# Patient Record
Sex: Male | Born: 1965 | Race: White | Hispanic: No | Marital: Married | Smoking: Never smoker
Health system: Southern US, Community
[De-identification: ages and names within clinical notes are randomized; demographics above are authoritative.]

## PROBLEM LIST (undated history)

## (undated) DIAGNOSIS — E785 Hyperlipidemia, unspecified: Secondary | ICD-10-CM

## (undated) HISTORY — PX: BACK SURGERY: SHX140

---

## 2010-07-21 ENCOUNTER — Inpatient Hospital Stay (INDEPENDENT_AMBULATORY_CARE_PROVIDER_SITE_OTHER)
Admission: RE | Admit: 2010-07-21 | Discharge: 2010-07-21 | Disposition: A | Discharge status: Home / Self Care | Payer: Self-pay | Source: Ambulatory Visit | Attending: Family Medicine | Admitting: Family Medicine

## 2010-07-21 ENCOUNTER — Encounter: Payer: Self-pay | Admitting: Family Medicine

## 2010-07-21 DIAGNOSIS — E782 Mixed hyperlipidemia: Secondary | ICD-10-CM | POA: Insufficient documentation

## 2010-07-21 DIAGNOSIS — H109 Unspecified conjunctivitis: Secondary | ICD-10-CM

## 2010-07-21 DIAGNOSIS — J069 Acute upper respiratory infection, unspecified: Secondary | ICD-10-CM

## 2010-07-21 DIAGNOSIS — E785 Hyperlipidemia, unspecified: Secondary | ICD-10-CM | POA: Insufficient documentation

## 2010-07-21 DIAGNOSIS — J0100 Acute maxillary sinusitis, unspecified: Secondary | ICD-10-CM

## 2010-07-21 DIAGNOSIS — H6690 Otitis media, unspecified, unspecified ear: Secondary | ICD-10-CM

## 2010-07-23 ENCOUNTER — Telehealth (INDEPENDENT_AMBULATORY_CARE_PROVIDER_SITE_OTHER): Payer: Self-pay | Admitting: *Deleted

## 2010-08-18 ENCOUNTER — Encounter: Payer: Self-pay | Admitting: Emergency Medicine

## 2010-08-18 ENCOUNTER — Inpatient Hospital Stay (INDEPENDENT_AMBULATORY_CARE_PROVIDER_SITE_OTHER)
Admission: RE | Admit: 2010-08-18 | Discharge: 2010-08-18 | Disposition: A | Discharge status: Home / Self Care | Payer: Private Health Insurance - Indemnity | Source: Ambulatory Visit | Attending: Emergency Medicine | Admitting: Emergency Medicine

## 2010-08-18 DIAGNOSIS — L2089 Other atopic dermatitis: Secondary | ICD-10-CM | POA: Insufficient documentation

## 2010-08-18 DIAGNOSIS — J069 Acute upper respiratory infection, unspecified: Secondary | ICD-10-CM

## 2010-08-18 DIAGNOSIS — J45909 Unspecified asthma, uncomplicated: Secondary | ICD-10-CM | POA: Insufficient documentation

## 2011-01-29 ENCOUNTER — Inpatient Hospital Stay (INDEPENDENT_AMBULATORY_CARE_PROVIDER_SITE_OTHER)
Admission: RE | Admit: 2011-01-29 | Discharge: 2011-01-29 | Disposition: A | Discharge status: Home / Self Care | Payer: Private Health Insurance - Indemnity | Source: Ambulatory Visit | Attending: Family Medicine | Admitting: Family Medicine

## 2011-01-29 ENCOUNTER — Encounter: Payer: Self-pay | Admitting: Family Medicine

## 2011-01-29 DIAGNOSIS — Q828 Other specified congenital malformations of skin: Secondary | ICD-10-CM | POA: Insufficient documentation

## 2011-01-29 DIAGNOSIS — L738 Other specified follicular disorders: Secondary | ICD-10-CM

## 2011-03-11 NOTE — Progress Notes (Signed)
Summary: cold/sore throat/ eye and ear pain  ( room 4)   Vital Signs:  Patient Profile:   45 Years Old Male CC:      Throat/ear(right)/eye pain Height:     72 inches Weight:      235 pounds O2 Sat:      96 % O2 treatment:    Room Air Temp:     99.4 degrees F oral Pulse rate:   100 / minute Resp:     16 per minute BP sitting:   135 / 88  Pt. in pain?   yes  Vitals Entered By: Lavell Islam RN (July 21, 2010 3:29 PM)                   Prior Medication List:  No prior medications documented  Current Allergies (reviewed today): ! AKNE-MYCIN (ERYTHROMYCIN)History of Present Illness Chief Complaint: Throat/ear(right)/eye pain History of Present Illness:  Subjective: Patient complains of sore throat that started one week ago with fever.  This was followed by nasal congestion and right ear feeling clogged + cough No pleuritic pain No wheezing + post-nasal drainage ? sinus pain/pressure + itchy/red eyes, worse on right, for about 2 days No earache but right ear feels clogged No hemoptysis No SOB + fever/chills No nausea No vomiting No abdominal pain No diarrhea No skin rashes + fatigue No myalgias + headache Used OTC meds without relief   REVIEW OF SYSTEMS Constitutional Symptoms       Complains of fever, chills, and fatigue.     Denies night sweats, weight loss, and weight gain.      Comments: Since 07/15/10 Eyes       Complains of eye pain and eye drainage.      Denies change in vision, glasses, contact lenses, and eye surgery.      Comments: since 4/10 Ear/Nose/Throat/Mouth       Complains of ear pain, frequent runny nose, and sore throat.      Denies hearing loss/aids, change in hearing, ear discharge, dizziness, frequent nose bleeds, sinus problems, hoarseness, and tooth pain or bleeding.      Comments: right eye Respiratory       Complains of dry cough.      Denies productive cough, wheezing, shortness of breath, asthma, bronchitis, and emphysema/COPD.    Cardiovascular       Denies murmurs, chest pain, and tires easily with exhertion.    Gastrointestinal       Denies stomach pain, nausea/vomiting, diarrhea, constipation, blood in bowel movements, and indigestion. Genitourniary       Denies painful urination, kidney stones, and loss of urinary control. Neurological       Complains of headaches.      Denies paralysis, seizures, and fainting/blackouts. Musculoskeletal       Denies muscle pain, joint pain, joint stiffness, decreased range of motion, redness, swelling, muscle weakness, and gout.  Skin       Denies bruising, unusual mles/lumps or sores, and hair/skin or nail changes.  Psych       Denies mood changes, temper/anger issues, anxiety/stress, speech problems, depression, and sleep problems. Other Comments: S&S throat/cold started 07/15/10; eye pain/redness started 4/10   Past History:  Family History: Last updated: 07/21/2010 Family History of Skin cancer  Social History: Last updated: 07/21/2010 Never Smoked Alcohol use-yes Drug use-no  Past Medical History: Hyperlipidemia  Past Surgical History: wisdom teeth removal  Family History: Reviewed history and no changes required. Family History of Skin cancer  Social History: Never Smoked Alcohol use-yes Drug use-no Smoking Status:  never Drug Use:  no   Objective:  No acute distress  Eyes:  Pupils are equal, round, and reactive to light and accomdation.  Extraocular movement is intact.  Right conjunctivae are injected.  Fundi negative.  No photophobia Ears:  Canals normal.  Tynmpanic membranes have serous effusion bilaterally and right TM is pink Nose:  Congested turbinates, worse on right with right maxillary sinus tenderness Pharynx:  Normal  Neck:  Supple.  Slightly tender shotty anterior/posterior nodes are palpated bilaterally.  Lungs:  Clear to auscultation.  Breath sounds are equal.  Heart:  Regular rate and rhythm without murmurs, rubs, or gallops.   Abdomen:  Nontender without masses or hepatosplenomegaly.  Bowel sounds are present.  No CVA or flank tenderness.  Extremities:  No edema.   Skin:  No rash Rapid strep test negative  Assessment New Problems: CONJUNCTIVITIS, MILD, RIGHT (ICD-372.30) OTITIS MEDIA, ACUTE, RIGHT (ICD-382.9) UPPER RESPIRATORY INFECTION (ICD-465.9) ACUTE MAXILLARY SINUSITIS (ICD-461.0) HYPERLIPIDEMIA (ICD-272.4)   Plan New Medications/Changes: SULFACETAMIDE SODIUM 10 % SOLN (SULFACETAMIDE SODIUM) 1 or 2 gtts in affected eye q2 to 3 hr  #10cc x 0, 07/21/2010, Donna Christen MD AMOXICILLIN 875 MG TABS (AMOXICILLIN) One by mouth two times a day  #28 x 0, 07/21/2010, Donna Christen MD  New Orders: Services provided After hours-Weekends-Holidays [99051] New Patient Level IV [99204] Rapid Strep [16109] Planning Comments:   Begin amoxicillin, expectorant/decongestant, cough suppressant at bedtime, sulfacetamide ophth suspension.  Increase fluid intake Followup with PCP if not improving 7 to 10 days   The patient and/or caregiver has been counseled thoroughly with regard to medications prescribed including dosage, schedule, interactions, rationale for use, and possible side effects and they verbalize understanding.  Diagnoses and expected course of recovery discussed and will return if not improved as expected or if the condition worsens. Patient and/or caregiver verbalized understanding.  Prescriptions: SULFACETAMIDE SODIUM 10 % SOLN (SULFACETAMIDE SODIUM) 1 or 2 gtts in affected eye q2 to 3 hr  #10cc x 0   Entered and Authorized by:   Donna Christen MD   Signed by:   Donna Christen MD on 07/21/2010   Method used:   Print then Give to Patient   RxID:   [id number] AMOXICILLIN 875 MG TABS (AMOXICILLIN) One by mouth two times a day  #28 x 0   Entered and Authorized by:   Donna Christen MD   Signed by:   Donna Christen MD on 07/21/2010   Method used:   Print then Give to Patient   RxID:    [id number]   Patient Instructions: 1)  Take Mucinex D (guaifenesin with decongestant) twice daily for congestion. 2)  Increase fluid intake, rest. 3)  May use Afrin nasal spray (or generic oxymetazoline) twice daily for about 5 days.  Also recommend using saline nasal spray several times daily and/or saline nasal irrigation. 4)  If cough becomes worse at night, use Delsym Cough Suppressant at bedtime. 5)  May take Ibuprofen 200mg , 4 tabs every 8 hours with food for sore thraot 6)  Followup with ENT physician if not improving 7 to 10 days.   Orders Added: 1)  Services provided After hours-Weekends-Holidays [99051] 2)  New Patient Level IV [99204] 3)  Rapid Strep [29528]    Laboratory Results  Date/Time Received: July 21, 2010 3:42 PM  Date/Time Reported: July 21, 2010 3:42 PM     Appended Document: cold/sore throat/ eye and ear pain  (  room 4) Rapid strep: Negative

## 2011-03-11 NOTE — Progress Notes (Signed)
Summary: F/U from last visit/TM (room 5)   Vital Signs:  Patient Profile:   45 Years Old Male CC:      Asthma Management Height:     72 inches Weight:      233 pounds O2 Sat:      97 % O2 treatment:    Room Air Temp:     99.1 degrees F oral Pulse rate:   76 / minute Resp:     18 per minute BP sitting:   125 / 84  (left arm) Cuff size:   regular  Vitals Entered By: Lavell Islam RN (Aug 18, 2010 12:17 PM)                  Current Allergies (reviewed today): ! AKNE-MYCIN (ERYTHROMYCIN)History of Present Illness History from: patient Chief Complaint: Asthma Management History of Present Illness: 45 Years Old Male complains of onset of cold symptoms for a few weeks.  Kasean had been seen here, treated with antibiotics and feels better.  Still with coughing now and mild asthma exacerbation.  He is also asking for a Triamcinolone cream refill for back eczema. No sore throat + cough No pleuritic pain + wheezing + nasal congestion + post-nasal drainage No sinus pain/pressure No chest congestion No itchy/red eyes No earache No hemoptysis No SOB No chills/sweats No fever No nausea No vomiting No abdominal pain No diarrhea No skin rashes No fatigue No myalgias No headache   REVIEW OF SYSTEMS Constitutional Symptoms      Denies fever, chills, night sweats, weight loss, weight gain, and fatigue.  Eyes       Denies change in vision, eye pain, eye discharge, glasses, contact lenses, and eye surgery. Ear/Nose/Throat/Mouth       Denies hearing loss/aids, change in hearing, ear pain, ear discharge, dizziness, frequent runny nose, frequent nose bleeds, sinus problems, sore throat, hoarseness, and tooth pain or bleeding.  Respiratory       Complains of dry cough, wheezing, shortness of breath, and asthma.      Denies productive cough, bronchitis, and emphysema/COPD.      Comments: no asthma x 2 years Cardiovascular       Denies murmurs, chest pain, and tires easily with  exhertion.    Gastrointestinal       Denies stomach pain, nausea/vomiting, diarrhea, constipation, blood in bowel movements, and indigestion. Genitourniary       Denies painful urination, kidney stones, and loss of urinary control. Neurological       Denies paralysis, seizures, and fainting/blackouts. Musculoskeletal       Denies muscle pain, joint pain, joint stiffness, decreased range of motion, redness, swelling, muscle weakness, and gout.  Skin       Denies bruising, unusual mles/lumps or sores, and hair/skin or nail changes.  Psych       Denies mood changes, temper/anger issues, anxiety/stress, speech problems, depression, and sleep problems. Other Comments: Recent tx for URI; now wheezing/ frequent cough and inhaler has expired   Past History:  Past Medical History: Hyperlipidemia Asthma  Family History: Reviewed history from 07/21/2010 and no changes required. Family History of Skin cancer  Social History: Reviewed history from 07/21/2010 and no changes required. Never Smoked Alcohol use-yes Drug use-no Physical Exam General appearance: well developed, well nourished, coughing during exam Ears: normal, no lesions or deformities Nasal: mucosa pink, nonedematous, no septal deviation, turbinates normal Oral/Pharynx: tongue normal, posterior pharynx without erythema or exudate Chest/Lungs: no rales, wheezes, or rhonchi bilateral, breath sounds  equal without effort Heart: regular rate and  rhythm, no murmur Back: mild back excoriations and irritation c/w eczema MSE: oriented to time, place, and person Assessment New Problems: DERMATITIS, ATOPIC (ICD-691.8) UPPER RESPIRATORY INFECTION, ACUTE (ICD-465.9) ASTHMA (ICD-493.90)   Plan New Medications/Changes: TRIAMCINOLONE ACETONIDE 0.1 % CREA (TRIAMCINOLONE ACETONIDE) apply to affected area two times a day as needed  #100g x 0, 08/18/2010, Hoyt Koch MD VENTOLIN HFA 108 (90 BASE) MCG/ACT AERS (ALBUTEROL SULFATE)  1-2 puffs q6 hrs as needed for cough/wheeze  #1 x 3, 08/18/2010, Hoyt Koch MD  New Orders: Est. Patient Level III [16109] Pulse Oximetry (single measurment) [94760] Services provided After hours-Weekends-Holidays [99051] Planning Comments:   1)  Likely allergic/asthma, so will treat with albuterol.  Patient doesn't want prednisone or solumedrol.  If not improving, may be a good idea to try. 2)  Use nasal saline solution (over the counter) at least 3 times a day. 3)  Use over the counter decongestants like Zyrtec-D every 12 hours as needed to help with congestion. 4)  Can take tylenol every 6 hours or motrin every 8 hours for pain or fever. 5)  Follow up with your primary doctor  if no improvement in 5-7 days, sooner if increasing pain, fever, or new symptoms.    The patient and/or caregiver has been counseled thoroughly with regard to medications prescribed including dosage, schedule, interactions, rationale for use, and possible side effects and they verbalize understanding.  Diagnoses and expected course of recovery discussed and will return if not improved as expected or if the condition worsens. Patient and/or caregiver verbalized understanding.  Prescriptions: TRIAMCINOLONE ACETONIDE 0.1 % CREA (TRIAMCINOLONE ACETONIDE) apply to affected area two times a day as needed  #100g x 0   Entered and Authorized by:   Hoyt Koch MD   Signed by:   Hoyt Koch MD on 08/18/2010   Method used:   Print then Give to Patient   RxID:   [id number] VENTOLIN HFA 108 (90 BASE) MCG/ACT AERS (ALBUTEROL SULFATE) 1-2 puffs q6 hrs as needed for cough/wheeze  #1 x 3   Entered and Authorized by:   Hoyt Koch MD   Signed by:   Hoyt Koch MD on 08/18/2010   Method used:   Print then Give to Patient   RxID:   [id number]   Orders Added: 1)  Est. Patient Level III [29528] 2)  Pulse Oximetry (single measurment) [94760] 3)  Services provided After  hours-Weekends-Holidays [41324]

## 2011-03-11 NOTE — Telephone Encounter (Signed)
  Phone Note Outgoing Call Call back at [facility] Phone [phone]   Call placed by: Lajean Saver RN,  July 23, 2010 11:39 AM Call placed to: Patient Action Taken: Phone Call Completed Summary of Call: Callback: Patient's symptoms are resolving slowly but he is still coughing a lot at night after taking the Delsym. I offered for something to be called in but he declined and wants to wait another day. Call back if questions or concerns.

## 2011-03-11 NOTE — Progress Notes (Signed)
Summary: lump on left armpit (rm 5)   Vital Signs:  Patient Profile:   45 Years Old Male CC:      lump in left axillia x 5 days Height:     72 inches Weight:      239 pounds O2 Sat:      98 % O2 treatment:    Room Air Temp:     97.7 degrees F oral Pulse rate:   78 / minute Resp:     14 per minute BP sitting:   128 / 81  (left arm) Cuff size:   large  Vitals Entered By: Lajean Saver RN (January 29, 2011 8:08 AM)                  Updated Prior Medication List: VENTOLIN HFA 108 (90 BASE) MCG/ACT AERS (ALBUTEROL SULFATE) 1-2 puffs q6 hrs as needed for cough/wheeze  Current Allergies (reviewed today): ! AKNE-MYCIN (ERYTHROMYCIN)History of Present Illness Chief Complaint: lump in left axillia x 5 days History of Present Illness:  Subjective:  Patient complains of noticing a slightly tender bump in his left axilla about 5 days ago, then 3 days ago noticed a ? "spider bite" on his left upper arm near left axilla.  He feels well otherwise. He also wonders if his chronic rash on back and upper outer arms could be related.  REVIEW OF SYSTEMS Constitutional Symptoms      Denies fever, chills, night sweats, weight loss, weight gain, and fatigue.  Eyes       Denies change in vision, eye pain, eye discharge, glasses, contact lenses, and eye surgery. Ear/Nose/Throat/Mouth       Denies hearing loss/aids, change in hearing, ear pain, ear discharge, dizziness, frequent runny nose, frequent nose bleeds, sinus problems, sore throat, hoarseness, and tooth pain or bleeding.  Respiratory       Denies dry cough, productive cough, wheezing, shortness of breath, asthma, bronchitis, and emphysema/COPD.  Cardiovascular       Denies murmurs, chest pain, and tires easily with exhertion.    Gastrointestinal       Denies stomach pain, nausea/vomiting, diarrhea, constipation, blood in bowel movements, and indigestion. Genitourniary       Denies painful urination, kidney stones, and loss of urinary  control. Neurological       Denies paralysis, seizures, and fainting/blackouts. Musculoskeletal       Denies muscle pain, joint pain, joint stiffness, decreased range of motion, redness, swelling, muscle weakness, and gout.  Skin       Complains of unusual moles/lumps or sores.      Denies bruising and hair/skin or nail changes.  Psych       Denies mood changes, temper/anger issues, anxiety/stress, speech problems, depression, and sleep problems. Other Comments: Patient c/o lump in left axillia x 5days. Without pain. No redness.    Past History:  Past Medical History: Reviewed history from 08/18/2010 and no changes required. Hyperlipidemia Asthma  Past Surgical History: Reviewed history from 07/21/2010 and no changes required. wisdom teeth removal  Family History: Reviewed history from 07/21/2010 and no changes required. Family History of Skin cancer  Social History: Reviewed history from 07/21/2010 and no changes required. Never Smoked Alcohol use-yes Drug use-no   Objective:  Appearance:  Patient appears healthy, stated age, and in no acute distress  Eyes:  Pupils are equal, round, and reactive to light and accomodation.  Extraocular movement is intact.  Conjunctivae are not inflamed.  Pharynx/mouth:  Normal  Neck:  Supple.  No adenopathy is present.   Lungs:  Clear to auscultation.  Breath sounds are equal.  Heart:  Regular rate and rhythm without murmurs, rubs, or gallops.  Abdomen:  Nontender without masses or hepatosplenomegaly.  Bowel sounds are present.  No CVA or flank tenderness.  Left axilla:  1cm node palpated, slightly tender.  Skin:  On left upper arm just distal to axilla are two follicular pustules on erythematous base.  On the back and upper outer arms are numerous small 1mm to 2mm erythematous macules, somewhat scaly, around follicles.  Upper outer arms reveal rough texture. 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 Patient Level III [81191] Planning Comments:   With sterile technique, opened small pustule left upper arm for culture specimen.  Bacitracin and bandage applied.  Begin Keflex. Given [facility] information/treatment handout for keratosis pilaris. Return for re-check if rash worsens.   The patient and/or caregiver has been counseled thoroughly with regard to medications prescribed including dosage, schedule, interactions, rationale for use, and possible side effects and they verbalize understanding.  Diagnoses and expected course of recovery discussed and will return if not improved as expected or if the condition worsens. Patient and/or caregiver verbalized understanding.  Prescriptions: CEPHALEXIN 500 MG CAPS (CEPHALEXIN) One by mouth two times a day  #14 x 0   Entered and Authorized by:   Donna Christen MD   Signed by:   Donna Christen MD on 01/29/2011   Method used:   Print then Give to Patient   RxID:   [id number]   Orders Added: 1)  T-Culture, Wound [[id number]] 2)  Est. Patient Level III [46962]

## 2011-04-15 ENCOUNTER — Emergency Department
Admission: EM | Admit: 2011-04-15 | Discharge: 2011-04-15 | Disposition: A | Discharge status: Home / Self Care | Payer: Private Health Insurance - Indemnity | Source: Home / Self Care | Attending: Emergency Medicine | Admitting: Emergency Medicine

## 2011-04-15 ENCOUNTER — Encounter: Payer: Self-pay | Admitting: *Deleted

## 2011-04-15 DIAGNOSIS — J069 Acute upper respiratory infection, unspecified: Secondary | ICD-10-CM

## 2011-04-15 DIAGNOSIS — J329 Chronic sinusitis, unspecified: Secondary | ICD-10-CM

## 2011-04-15 HISTORY — DX: Hyperlipidemia, unspecified: E78.5

## 2011-04-15 MED ORDER — AMOXICILLIN 875 MG PO TABS: 875.0000 mg | ORAL_TABLET | Freq: Two times a day (BID) | ORAL | Status: AC

## 2011-04-15 NOTE — ED Notes (Signed)
Patient c/o congestion x 6 weeks. He has used saline nasal spray.

## 2011-04-15 NOTE — ED Provider Notes (Signed)
History     CSN: [id number]  Arrival date & time 04/15/11  4098   First MD Initiated Contact with Patient 04/15/11 [phone]      Chief Complaint  Patient presents with  . Nasal Congestion    (Consider location/radiation/quality/duration/timing/severity/associated sxs/prior treatment) HPI Rhiley is a 46 y.o. male who complains of onset of cold symptoms for 6 weeks  No sore throat No cough No pleuritic pain No wheezing + nasal congestion + post-nasal drainage + sinus pain/pressure No chest congestion No itchy/red eyes No earache No hemoptysis No SOB No chills/sweats No fever No nausea No vomiting No abdominal pain No diarrhea No skin rashes No fatigue No myalgias No headache    Past Medical History  Diagnosis Date  . Asthma   . Hyperlipidemia     History reviewed. No pertinent past surgical history.  History reviewed. No pertinent family history.  History  Substance Use Topics  . Smoking status: Never Smoker   . Smokeless tobacco: Not on file  . Alcohol Use: No      Review of Systems  Allergies  Akne-mycin  Home Medications   Current Outpatient Rx  Name Route Sig Dispense Refill  . ALBUTEROL SULFATE (2.5 MG/3ML) 0.083% IN NEBU Nebulization Take 2.5 mg by nebulization every 6 (six) hours as needed.        BP 127/89  Pulse 75  Temp(Src) 97.6 F (36.4 C) (Oral)  Resp 12  Ht 6' (1.829 m)  Wt 240 lb (108.863 kg)  BMI 32.55 kg/m2  SpO2 99%  Physical Exam  Nursing note and vitals reviewed. Constitutional: He is oriented to person, place, and time. He appears well-developed and well-nourished.  HENT:  Head: Normocephalic and atraumatic.  Right Ear: Tympanic membrane, external ear and ear canal normal.  Left Ear: Tympanic membrane, external ear and ear canal normal.  Nose: Mucosal edema and rhinorrhea present.  Mouth/Throat: Posterior oropharyngeal erythema present. No oropharyngeal exudate or posterior oropharyngeal edema.  Eyes: No scleral  icterus.  Neck: Neck supple.  Cardiovascular: Regular rhythm and normal heart sounds.   Pulmonary/Chest: Effort normal and breath sounds normal. No respiratory distress.  Neurological: He is alert and oriented to person, place, and time.  Skin: Skin is warm and dry.  Psychiatric: He has a normal mood and affect. His speech is normal.    ED Course  Procedures (including critical care time)  Labs Reviewed - No data to display No results found.   No diagnosis found.    MDM  1)  Take the prescribed antibiotic as instructed. 2)  Use nasal saline solution (over the counter) at least 3 times a day. 3)  Use over the counter decongestants like Zyrtec-D every 12 hours as needed to help with congestion.  If you have hypertension, do not take medicines with sudafed.  4)  Can take tylenol every 6 hours or motrin every 8 hours for pain or fever. 5)  Follow up with your primary doctor if no improvement in 5-7 days, sooner if increasing pain, fever, or new symptoms.     Lily Kocher, MD 04/15/11 [phone]

## 2011-09-28 ENCOUNTER — Encounter: Payer: Self-pay | Admitting: Emergency Medicine

## 2011-09-28 ENCOUNTER — Emergency Department
Admission: EM | Admit: 2011-09-28 | Discharge: 2011-09-28 | Disposition: A | Discharge status: Home / Self Care | Payer: Private Health Insurance - Indemnity | Source: Home / Self Care | Attending: Family Medicine | Admitting: Family Medicine

## 2011-09-28 DIAGNOSIS — J45901 Unspecified asthma with (acute) exacerbation: Secondary | ICD-10-CM

## 2011-09-28 DIAGNOSIS — J069 Acute upper respiratory infection, unspecified: Secondary | ICD-10-CM

## 2011-09-28 MED ORDER — BENZONATATE 200 MG PO CAPS: 200.0000 mg | ORAL_CAPSULE | Freq: Every day | ORAL | Status: AC

## 2011-09-28 MED ORDER — AMOXICILLIN 875 MG PO TABS: 875.0000 mg | ORAL_TABLET | Freq: Two times a day (BID) | ORAL | Status: AC

## 2011-09-28 MED ORDER — PREDNISONE 20 MG PO TABS: 20.0000 mg | ORAL_TABLET | Freq: Two times a day (BID) | ORAL | Status: AC

## 2011-09-28 MED ORDER — ALBUTEROL SULFATE HFA 108 (90 BASE) MCG/ACT IN AERS: 2.0000 | INHALATION_SPRAY | RESPIRATORY_TRACT | Status: DC | PRN

## 2011-09-28 NOTE — Discharge Instructions (Signed)
Take Mucinex D (guaifenesin with decongestant) twice daily for congestion.  Increase fluid intake, rest. May use Afrin nasal spray (or generic oxymetazoline) twice daily for about 5 days.  Also recommend using saline nasal spray several times daily and saline nasal irrigation (AYR is a common brand) Stop all antihistamines for now, and other non-prescription cough/cold preparations. Continue albuterol inhaler as needed. Begin amoxicillin if not improving about 5 days or if persistent fever develops. Recommend a Tdap when well Follow-up with family doctor if not improving 7 to 10 days.

## 2011-09-28 NOTE — ED Provider Notes (Signed)
History     CSN: [id number]  Arrival date & time 09/28/11  1322   First MD Initiated Contact with Patient 09/28/11 1410      Chief Complaint  Patient presents with  . Facial Pain      HPI Comments: Patient complains of approximately 3 day history of gradually progressive URI symptoms beginning with a mild sore throat (now improved), followed by progressive nasal congestion.  A cough started next.    Complains of fatigue and initial myalgias.  Cough is now worse at night and generally non-productive during the day.  There has been no pleuritic pain or shortness of breath but he does wheeze occasionally with activity.  He has a history of mild asthma, but rarely ever needs to use his albuterol inhaler when he is well.  Over the past several days he has used his inhaler several times.  The history is provided by the patient.    Past Medical History  Diagnosis Date  . Asthma   . Hyperlipidemia     History reviewed. No pertinent past surgical history.  History reviewed. No pertinent family history.  History  Substance Use Topics  . Smoking status: Never Smoker   . Smokeless tobacco: Not on file  . Alcohol Use: No      Review of Systems + sore throat + cough No pleuritic pain + wheezing + nasal congestion + post-nasal drainage No sinus pain/pressure No itchy/red eyes No earache No hemoptysis No SOB No fever, + chills No nausea No vomiting No abdominal pain No diarrhea No urinary symptoms No skin rashes + fatigue + myalgias No headache Used OTC meds without relief  Allergies  Erythromycin  Home Medications   Current Outpatient Rx  Name Route Sig Dispense Refill  . ALBUTEROL SULFATE HFA 108 (90 BASE) MCG/ACT IN AERS Inhalation Inhale 2 puffs into the lungs every 4 (four) hours as needed for wheezing. 1 Inhaler 1  . ALBUTEROL SULFATE (2.5 MG/3ML) 0.083% IN NEBU Nebulization Take 2.5 mg by nebulization every 6 (six) hours as needed.      . AMOXICILLIN 875  MG PO TABS Oral Take 1 tablet (875 mg total) by mouth 2 (two) times daily. (Rx void after 10/07/11) 20 tablet 0  . BENZONATATE 200 MG PO CAPS Oral Take 1 capsule (200 mg total) by mouth at bedtime. Take as needed for cough 12 capsule 0  . PREDNISONE 20 MG PO TABS Oral Take 1 tablet (20 mg total) by mouth 2 (two) times daily. 10 tablet 0    BP 144/78  Pulse 78  Temp 98.3 F (36.8 C) (Oral)  Resp 16  Ht 6' (1.829 m)  Wt 240 lb (108.863 kg)  BMI 32.55 kg/m2  SpO2 96%  Physical Exam Nursing notes and Vital Signs reviewed. Appearance:  Patient appears healthy, stated age, and in no acute distress Eyes:  Pupils are equal, round, and reactive to light and accomodation.  Extraocular movement is intact.  Conjunctivae are not inflamed  Ears:  Canals normal.  Tympanic membranes normal.  Nose:  Mildly congested turbinates.  No sinus tenderness.  Pharynx:  Normal Neck:  Supple.  Slightly tender shotty posterior nodes are palpated bilaterally  Lungs:  Clear to auscultation.  Breath sounds are equal.  Heart:  Regular rate and rhythm without murmurs, rubs, or gallops.  Abdomen:  Nontender without masses or hepatosplenomegaly.  Bowel sounds are present.  No CVA or flank tenderness.  Extremities:  No edema.  No calf tenderness Skin:  No rash present.   ED Course  Procedures  none      1. Acute upper respiratory infections of unspecified site   2. Asthma exacerbation       MDM  There is no evidence of bacterial infection today.   Begin prednisone burst.  Rx for albuterol inhaler.  Prescription written for Benzonatate [facility]) to take at bedtime for night-time cough.  Take Mucinex D (guaifenesin with decongestant) twice daily for congestion.  Increase fluid intake, rest. May use Afrin nasal spray (or generic oxymetazoline) twice daily for about 5 days.  Also recommend using saline nasal spray several times daily and saline nasal irrigation (AYR is a common brand) Stop all antihistamines for  now, and other non-prescription cough/cold preparations. Continue albuterol inhaler as needed. Begin amoxicillin if not improving about 5 days or if persistent fever develops (Given a prescription to hold, with an expiration date)  Recommend a Tdap when well Follow-up with family doctor if not improving 7 to 10 days.         Lattie Haw, MD 09/28/11 [phone]

## 2011-09-28 NOTE — ED Notes (Signed)
Sinus congestion x 3 days; states not painful, but recent similar episodes required antibiotics.

## 2011-11-08 ENCOUNTER — Other Ambulatory Visit: Payer: Self-pay | Admitting: Family Medicine

## 2011-11-08 NOTE — Telephone Encounter (Signed)
Please call patient. 1. Is he a regular patient here or at [facility] with Dr. Cathren Harsh? 2. I'm concerned about his use of the rescue inhaler-I see he's had several URI's recently, but he may need a medication adjustment if he's using the rescue regularly.

## 2011-11-08 NOTE — Telephone Encounter (Signed)
No paper chart °

## 2012-07-22 ENCOUNTER — Emergency Department (INDEPENDENT_AMBULATORY_CARE_PROVIDER_SITE_OTHER)
Admission: EM | Admit: 2012-07-22 | Discharge: 2012-07-22 | Disposition: A | Discharge status: Home / Self Care | Payer: BC Managed Care – PPO | Source: Home / Self Care | Attending: Family Medicine | Admitting: Family Medicine

## 2012-07-22 ENCOUNTER — Encounter: Payer: Self-pay | Admitting: *Deleted

## 2012-07-22 DIAGNOSIS — M545 Low back pain: Secondary | ICD-10-CM

## 2012-07-22 DIAGNOSIS — M62838 Other muscle spasm: Secondary | ICD-10-CM

## 2012-07-22 DIAGNOSIS — M7712 Lateral epicondylitis, left elbow: Secondary | ICD-10-CM

## 2012-07-22 DIAGNOSIS — M7710 Lateral epicondylitis, unspecified elbow: Secondary | ICD-10-CM

## 2012-07-22 MED ORDER — MELOXICAM 15 MG PO TABS: 15.0000 mg | ORAL_TABLET | Freq: Every day | ORAL | Status: DC

## 2012-07-22 MED ORDER — CYCLOBENZAPRINE HCL 10 MG PO TABS: 10.0000 mg | ORAL_TABLET | Freq: Two times a day (BID) | ORAL | Status: DC | PRN

## 2012-07-22 NOTE — ED Notes (Signed)
Martin Kennedy c/o lower right back pain/gluteal pain x 2 days. Numbness and tingling down his right leg. Pain is present with movement.

## 2012-07-22 NOTE — ED Provider Notes (Signed)
History     CSN: [id number]  Arrival date & time 07/22/12  1320   First MD Initiated Contact with Patient 07/22/12 1411      Chief Complaint  Patient presents with  . Back Pain       HPI Comments: Patient was using a shovel two days ago and subsequently developed pain in his right lower back/buttock that radiates to his right posterior calf with some movements.  He states that he has an occasional flare-up of lower back pain.  About four years ago he had a persistent flare-up of back pain.  An MRI at that time showed questionable impingement at L4-L5, but his back pain resolved spontaneously.  He denies bowel or bladder dysfunction, and no saddle numbness. He also complains of recurring stiffness in his right neck and intermittent tingling in his left arm that is positional.  Patient is a 47 y.o. male presenting with back pain. The history is provided by the patient.  Back Pain Location:  Gluteal region Quality:  Aching Radiates to:  R posterior upper leg Pain severity:  Mild Pain is:  Same all the time Onset quality:  Gradual Duration:  2 days Timing:  Constant Progression:  Unchanged Chronicity:  Recurrent Context comment:  Shovelling Relieved by:  Nothing Worsened by:  Movement and bending Ineffective treatments:  NSAIDs Associated symptoms: leg pain   Associated symptoms: no abdominal pain, no bladder incontinence, no bowel incontinence, no dysuria, no fever, no numbness, no paresthesias, no pelvic pain, no perianal numbness, no tingling, no weakness and no weight loss     Past Medical History  Diagnosis Date  . Asthma   . Hyperlipidemia     History reviewed. No pertinent past surgical history.  History reviewed. No pertinent family history.  History  Substance Use Topics  . Smoking status: Never Smoker   . Smokeless tobacco: Not on file  . Alcohol Use: No      Review of Systems  Constitutional: Negative for fever and weight loss.  Gastrointestinal:  Negative for abdominal pain and bowel incontinence.  Genitourinary: Negative for bladder incontinence, dysuria and pelvic pain.  Musculoskeletal: Positive for back pain.  Neurological: Negative for tingling, weakness, numbness and paresthesias.  All other systems reviewed and are negative.    Allergies  Erythromycin  Home Medications   Current Outpatient Rx  Name  Route  Sig  Dispense  Refill  . albuterol (PROVENTIL HFA;VENTOLIN HFA) 108 (90 BASE) MCG/ACT inhaler   Inhalation   Inhale 2 puffs into the lungs every 4 (four) hours as needed for wheezing.   1 Inhaler   1   . cyclobenzaprine (FLEXERIL) 10 MG tablet   Oral   Take 1 tablet (10 mg total) by mouth 2 (two) times daily as needed for muscle spasms.   20 tablet   0   . meloxicam (MOBIC) 15 MG tablet   Oral   Take 1 tablet (15 mg total) by mouth daily. Take with food each morning   15 tablet   0     BP 139/81  Pulse 72  Resp 14  Ht 6' (1.829 m)  Wt 242 lb (109.77 kg)  BMI 32.81 kg/m2  SpO2 98%  Physical Exam Nursing notes and Vital Signs reviewed. Appearance:  Patient appears healthy, stated age, and in no acute distress Eyes:  Pupils are equal, round, and reactive to light and accomodation.  Extraocular movement is intact.  Conjunctivae are not inflamed  Pharynx:  Normal Neck:  Supple.  No adenopathy.  Good range of motion with minimal trapezius tenderness. Lungs:  Clear to auscultation.  Breath sounds are equal.  Heart:  Regular rate and rhythm without murmurs, rubs, or gallops.   Extremities:  No edema.  Left shoulder full range of motion.  Left elbow:  There is distinct tenderness over the lateral epicondyle.  Palpation there during resisted dorsiflexion and supination of the wrist recreates his elbow pain.  Skin:  No rash present.   Back:  Good range of motion.  Can heel/toe walk and squat without difficulty.  No tenderness palpated.    Straight leg raising test is negative.  Sitting knee extension test is  negative.  Strength and sensation in the lower extremities is normal.  Patellar and achilles reflexes are normal  ED Course  Procedures        1. Low back pain radiating to right leg; consider possibility of piriformis syndrome   2. Neck muscle spasm; recurrent   3. Lateral epicondylitis, left       MDM  Patient declines corticosteroid injection and X-ray C-spine. Begin Mobic and Flexeril Apply ice pack for 30 to 45 minutes every 1 to 4 hours.  Continue for 2 to 3 days.  Begin range of motion exercises as per instruction sheets ([facility] information and instruction handouts given)   Resume left tennis elbow strap (patient has one at home) Followup with Dr. Rodney Langton for further evaluation and management in one week.        Lattie Haw, MD 07/22/12 2003

## 2012-07-30 ENCOUNTER — Ambulatory Visit (INDEPENDENT_AMBULATORY_CARE_PROVIDER_SITE_OTHER): Payer: BC Managed Care – PPO

## 2012-07-30 ENCOUNTER — Encounter: Payer: Self-pay | Admitting: Sports Medicine

## 2012-07-30 ENCOUNTER — Ambulatory Visit (INDEPENDENT_AMBULATORY_CARE_PROVIDER_SITE_OTHER): Payer: BC Managed Care – PPO | Admitting: Sports Medicine

## 2012-07-30 VITALS — BP 143/90 | HR 70 | Wt 241.0 lb

## 2012-07-30 DIAGNOSIS — M5412 Radiculopathy, cervical region: Secondary | ICD-10-CM

## 2012-07-30 NOTE — Progress Notes (Signed)
   Subjective:    I'm seeing this patient as a consultation for:  Dr. Cathren Harsh  CC: Neck pain  HPI: Martin Kennedy is a very pleasant 70 her old male with a history of lumbar radiculitis that has since resolved he went to urgent care with neck pain radiating down his left arm in the C6 distribution. He doesn't recall any specific injuries. He was evaluated, and was prescribed Mobic and Flexeril which she has not yet taken. He is only taking occasional ibuprofen. Since then the pain has resolved, and he notes about a 1/10. He still does have some numbness that he localizes in the left C6 distribution. Symptoms are worse with rotating his head to the left and with extension. He denies any bowel or bladder dysfunction, or any weakness in his lower extremities. He really desires a very conservative approach minimizing the use of any medications.  Past medical history, Surgical history, Family history not pertinant except as noted below, Social history, Allergies, and medications have been entered into the medical record, reviewed, and no changes needed.   Review of Systems: No headache, visual changes, nausea, vomiting, diarrhea, constipation, dizziness, abdominal pain, skin rash, fevers, chills, night sweats, weight loss, swollen lymph nodes, body aches, joint swelling, muscle aches, chest pain, shortness of breath, mood changes, visual or auditory hallucinations.   Objective:   General: Well Developed, well nourished, and in no acute distress.  Neuro/Psych: Alert and oriented x3, extra-ocular muscles intact, able to move all 4 extremities, sensation grossly intact. Skin: Warm and dry, no rashes noted.  Respiratory: Not using accessory muscles, speaking in full sentences, trachea midline.  Cardiovascular: Pulses palpable, no extremity edema. Abdomen: Does not appear distended. Neck: Inspection unremarkable. No palpable stepoffs. Positive Spurling's maneuver. Full neck range of motion Grip strength and  sensation normal in bilateral hands Strength good C4 to T1 distribution No sensory change to C4 to T1 Negative Hoffman sign bilaterally Reflexes normal with the exception of left C6/biceps tendon. This is hypoactive.  X-rays show straightening of the normal cervical lordosis suggestive of spasm. Impression and Recommendations:   This case required medical decision making of moderate complexity.

## 2012-07-30 NOTE — Assessment & Plan Note (Signed)
Left C6. Pain has resolved, numbness persists. He would rather treat this extremely conservatively without oral medications. I think this is appropriate, cervical rehabilitation exercises given. I would like some cervical spine x-rays, he'll come back to see me in one month.

## 2012-08-19 ENCOUNTER — Encounter: Payer: Self-pay | Admitting: Sports Medicine

## 2012-08-19 ENCOUNTER — Ambulatory Visit (INDEPENDENT_AMBULATORY_CARE_PROVIDER_SITE_OTHER): Payer: BC Managed Care – PPO | Admitting: Sports Medicine

## 2012-08-19 VITALS — BP 146/86 | HR 73 | Wt 241.0 lb

## 2012-08-19 DIAGNOSIS — M5412 Radiculopathy, cervical region: Secondary | ICD-10-CM

## 2012-08-19 MED ORDER — MELOXICAM 15 MG PO TABS: ORAL_TABLET | ORAL | Status: DC

## 2012-08-19 MED ORDER — PREDNISONE 50 MG PO TABS: ORAL_TABLET | ORAL | Status: DC

## 2012-08-19 NOTE — Progress Notes (Signed)
  Subjective:    CC: Followup  HPI: Left cervical radiculitis:  Crit, whose father is a retired Investment banker, operational, comes in for followup of the left C6 radiculitis consisting mostly of numbness. He wanted to go extremely conservative at the last visit, and not do any medications or therapy. He returns today with symptoms approximately the same, and a sensation of weakening of his lower extremities. He denies any bowel or bladder dysfunction or saddle anesthesia, denies any constitutional symptoms.  Past medical history, Surgical history, Family history not pertinant except as noted below, Social history, Allergies, and medications have been entered into the medical record, reviewed, and no changes needed.   Review of Systems: No fevers, chills, night sweats, weight loss, chest pain, or shortness of breath.   Objective:    General: Well Developed, well nourished, and in no acute distress.  Neuro: Alert and oriented x3, extra-ocular muscles intact, sensation grossly intact.  HEENT: Normocephalic, atraumatic, pupils equal round reactive to light, neck supple, no masses, no lymphadenopathy, thyroid nonpalpable.  Skin: Warm and dry, no rashes. Cardiac: Regular rate and rhythm, no murmurs rubs or gallops, no lower extremity edema.  Respiratory: Clear to auscultation bilaterally. Not using accessory muscles, speaking in full sentences. Neck: Inspection unremarkable. No palpable stepoffs. Negative Spurling's maneuver. Full neck range of motion Grip strength and sensation normal in bilateral hands Strength good C4 to T1 distribution No sensory change to C4 to T1 Negative Hoffman sign bilaterally Reflexes normal.  I spent 40 minutes with this patient, greater than 50% face time counseling regarding cervical radiculitis  Impression and Recommendations:

## 2012-08-19 NOTE — Assessment & Plan Note (Addendum)
Left C6. We discussed anatomy and pathophysiology in exquisite detail. Unfortunately we have been waiting, and treating him with medication. Symptoms have persisted, prednisone, cervical spine MRI, formal physical therapy. Continue Mobic. Return to go over MRI results.

## 2012-08-20 ENCOUNTER — Ambulatory Visit: Payer: BC Managed Care – PPO | Admitting: Physical Therapy

## 2012-08-20 DIAGNOSIS — M5412 Radiculopathy, cervical region: Secondary | ICD-10-CM

## 2012-08-20 DIAGNOSIS — M2560 Stiffness of unspecified joint, not elsewhere classified: Secondary | ICD-10-CM

## 2012-08-21 ENCOUNTER — Telehealth: Payer: Self-pay | Admitting: *Deleted

## 2012-08-21 NOTE — Telephone Encounter (Signed)
[place] [employer] [place] and no precert is required for MRI Cervical Spine w/o contrast as long as done outpatient. Helen @ [facility] notified. Barry Dienes, LPN

## 2012-08-21 NOTE — Telephone Encounter (Signed)
Spoke with [employer] & no pre cert is needed for MRI c-spine WO contrast.  Linda in [facility] notified.

## 2012-08-24 ENCOUNTER — Encounter: Payer: BC Managed Care – PPO | Admitting: Physical Therapy

## 2012-08-24 DIAGNOSIS — M5412 Radiculopathy, cervical region: Secondary | ICD-10-CM

## 2012-08-24 DIAGNOSIS — M2560 Stiffness of unspecified joint, not elsewhere classified: Secondary | ICD-10-CM

## 2012-08-27 ENCOUNTER — Encounter: Payer: BC Managed Care – PPO | Admitting: Physical Therapy

## 2012-08-27 DIAGNOSIS — M2560 Stiffness of unspecified joint, not elsewhere classified: Secondary | ICD-10-CM

## 2012-08-27 DIAGNOSIS — M5412 Radiculopathy, cervical region: Secondary | ICD-10-CM

## 2012-08-28 ENCOUNTER — Ambulatory Visit: Payer: BC Managed Care – PPO | Admitting: Sports Medicine

## 2012-09-02 ENCOUNTER — Encounter: Payer: BC Managed Care – PPO | Admitting: Physical Therapy

## 2012-09-02 DIAGNOSIS — M5412 Radiculopathy, cervical region: Secondary | ICD-10-CM

## 2012-09-02 DIAGNOSIS — M2560 Stiffness of unspecified joint, not elsewhere classified: Secondary | ICD-10-CM

## 2012-09-04 ENCOUNTER — Encounter: Payer: BC Managed Care – PPO | Admitting: Physical Therapy

## 2012-09-04 DIAGNOSIS — M5412 Radiculopathy, cervical region: Secondary | ICD-10-CM

## 2012-09-04 DIAGNOSIS — M2560 Stiffness of unspecified joint, not elsewhere classified: Secondary | ICD-10-CM

## 2012-09-11 ENCOUNTER — Encounter: Payer: BC Managed Care – PPO | Admitting: Physical Therapy

## 2012-09-11 DIAGNOSIS — M2560 Stiffness of unspecified joint, not elsewhere classified: Secondary | ICD-10-CM

## 2012-09-11 DIAGNOSIS — M5412 Radiculopathy, cervical region: Secondary | ICD-10-CM

## 2012-09-18 ENCOUNTER — Encounter: Payer: BC Managed Care – PPO | Admitting: Physical Therapy

## 2012-09-18 DIAGNOSIS — M2560 Stiffness of unspecified joint, not elsewhere classified: Secondary | ICD-10-CM

## 2012-09-18 DIAGNOSIS — M5412 Radiculopathy, cervical region: Secondary | ICD-10-CM

## 2012-12-24 ENCOUNTER — Ambulatory Visit (INDEPENDENT_AMBULATORY_CARE_PROVIDER_SITE_OTHER): Payer: BC Managed Care – PPO | Admitting: Sports Medicine

## 2012-12-24 ENCOUNTER — Encounter: Payer: Self-pay | Admitting: Sports Medicine

## 2012-12-24 ENCOUNTER — Ambulatory Visit (INDEPENDENT_AMBULATORY_CARE_PROVIDER_SITE_OTHER): Payer: BC Managed Care – PPO

## 2012-12-24 VITALS — BP 137/87 | HR 64 | Wt 246.0 lb

## 2012-12-24 DIAGNOSIS — S6390XA Sprain of unspecified part of unspecified wrist and hand, initial encounter: Secondary | ICD-10-CM

## 2012-12-24 DIAGNOSIS — M79609 Pain in unspecified limb: Secondary | ICD-10-CM

## 2012-12-24 DIAGNOSIS — X58XXXA Exposure to other specified factors, initial encounter: Secondary | ICD-10-CM

## 2012-12-24 DIAGNOSIS — S6980XA Other specified injuries of unspecified wrist, hand and finger(s), initial encounter: Secondary | ICD-10-CM

## 2012-12-24 DIAGNOSIS — M5412 Radiculopathy, cervical region: Secondary | ICD-10-CM

## 2012-12-24 DIAGNOSIS — M19049 Primary osteoarthritis, unspecified hand: Secondary | ICD-10-CM | POA: Insufficient documentation

## 2012-12-24 DIAGNOSIS — S63615A Unspecified sprain of left ring finger, initial encounter: Secondary | ICD-10-CM

## 2012-12-24 NOTE — Assessment & Plan Note (Signed)
Resolved with physical therapy. 

## 2012-12-24 NOTE — Assessment & Plan Note (Signed)
Pain at the volar fourth metacarpophalangeal joint, function is excellent. X-rays. Buddy tape. We will keep him Buddy taped for 2 weeks, if no improvement we can consider injection into the metacarpophalangeal joint.

## 2012-12-24 NOTE — Progress Notes (Signed)
  Subjective:    CC: Finger injury  HPI: Cervical radiculitis: resolved with physical therapy.  Finger injury: 3 weeks ago he sprained his left fourth metacarpal phalangeal joint, continued to have pain in the volar aspect, but excellent function, no swelling, no bruising, symptoms are mild, persistent.  Past medical history, Surgical history, Family history not pertinant except as noted below, Social history, Allergies, and medications have been entered into the medical record, reviewed, and no changes needed.   Review of Systems: No fevers, chills, night sweats, weight loss, chest pain, or shortness of breath.   Objective:    General: Well Developed, well nourished, and in no acute distress.  Neuro: Alert and oriented x3, extra-ocular muscles intact, sensation grossly intact.  HEENT: Normocephalic, atraumatic, pupils equal round reactive to light, neck supple, no masses, no lymphadenopathy, thyroid nonpalpable.  Skin: Warm and dry, no rashes. Cardiac: Regular rate and rhythm, no murmurs rubs or gallops, no lower extremity edema.  Respiratory: Clear to auscultation bilaterally. Not using accessory muscles, speaking in full sentences. Left hand: There is tenderness to palpation over the volar distal fourth metacarpal, excellent strength to lumbricals, flexor digitorum profundus, flexor digitorum superficialis. Collaterals are stable.  X-rays were reviewed there is no sign of fracture.  The fingers were buddy taped.  Impression and Recommendations:

## 2012-12-30 NOTE — Addendum Note (Signed)
Addended by: Monica Becton on: 12/30/2012 10:06 AM   Modules accepted: Level of Service

## 2013-04-22 ENCOUNTER — Encounter: Payer: Self-pay | Admitting: Sports Medicine

## 2013-04-22 ENCOUNTER — Ambulatory Visit (INDEPENDENT_AMBULATORY_CARE_PROVIDER_SITE_OTHER): Payer: BC Managed Care – PPO | Admitting: Sports Medicine

## 2013-04-22 VITALS — BP 138/86 | HR 66 | Wt 246.0 lb

## 2013-04-22 DIAGNOSIS — M5412 Radiculopathy, cervical region: Secondary | ICD-10-CM

## 2013-04-22 DIAGNOSIS — M19049 Primary osteoarthritis, unspecified hand: Secondary | ICD-10-CM

## 2013-04-22 MED ORDER — MELOXICAM 15 MG PO TABS: ORAL_TABLET | ORAL | Status: DC

## 2013-04-22 NOTE — Assessment & Plan Note (Signed)
Excellent response for 8 months after physical therapy. Repeating physical therapy. Return in 6 weeks.

## 2013-04-22 NOTE — Progress Notes (Signed)
  Subjective:    CC: Neck pain  HPI: Martin Kennedy comes back, I saw him previously for cervical radiculitis which resolved with conservative measures, he has a recurrence of pain that he localizes in his neck predominately at night but also in his proximal interphalangeal joints of his hands bilaterally, worse in the morning consisting of stiffness and pain but this resolves very quickly as he moves them. Pain is moderate, persistent, he does desire no further imaging, but would like more physical therapy. He was somewhat resistant but agreeable to NSAID therapy.  Past medical history, Surgical history, Family history not pertinant except as noted below, Social history, Allergies, and medications have been entered into the medical record, reviewed, and no changes needed.   Review of Systems: No fevers, chills, night sweats, weight loss, chest pain, or shortness of breath.   Objective:    General: Well Developed, well nourished, and in no acute distress.  Neuro: Alert and oriented x3, extra-ocular muscles intact, sensation grossly intact.  HEENT: Normocephalic, atraumatic, pupils equal round reactive to light, neck supple, no masses, no lymphadenopathy, thyroid nonpalpable.  Skin: Warm and dry, no rashes. Cardiac: Regular rate and rhythm, no murmurs rubs or gallops, no lower extremity edema.  Respiratory: Clear to auscultation bilaterally. Not using accessory muscles, speaking in full sentences. Hands: Swelling over the proximal and distal interphalangeal joints. Minimal tenderness to palpation on nearly all of the fingers.  Impression and Recommendations:

## 2013-04-22 NOTE — Assessment & Plan Note (Signed)
Localized predominately over the proximal interphalangeal joints. Mobic.

## 2013-04-26 ENCOUNTER — Ambulatory Visit: Payer: BC Managed Care – PPO | Admitting: Physical Therapy

## 2013-04-27 ENCOUNTER — Ambulatory Visit (INDEPENDENT_AMBULATORY_CARE_PROVIDER_SITE_OTHER): Payer: BC Managed Care – PPO | Admitting: Physical Therapy

## 2013-04-27 DIAGNOSIS — M2560 Stiffness of unspecified joint, not elsewhere classified: Secondary | ICD-10-CM

## 2013-04-27 DIAGNOSIS — M5412 Radiculopathy, cervical region: Secondary | ICD-10-CM

## 2013-04-29 ENCOUNTER — Encounter (INDEPENDENT_AMBULATORY_CARE_PROVIDER_SITE_OTHER): Payer: BC Managed Care – PPO

## 2013-04-29 DIAGNOSIS — M2560 Stiffness of unspecified joint, not elsewhere classified: Secondary | ICD-10-CM

## 2013-04-29 DIAGNOSIS — M5412 Radiculopathy, cervical region: Secondary | ICD-10-CM

## 2013-05-03 ENCOUNTER — Encounter (INDEPENDENT_AMBULATORY_CARE_PROVIDER_SITE_OTHER): Payer: BC Managed Care – PPO | Admitting: Physical Therapy

## 2013-05-03 DIAGNOSIS — M5412 Radiculopathy, cervical region: Secondary | ICD-10-CM

## 2013-05-03 DIAGNOSIS — M2560 Stiffness of unspecified joint, not elsewhere classified: Secondary | ICD-10-CM

## 2013-05-05 ENCOUNTER — Encounter (INDEPENDENT_AMBULATORY_CARE_PROVIDER_SITE_OTHER): Payer: BC Managed Care – PPO | Admitting: Physical Therapy

## 2013-05-05 DIAGNOSIS — M2560 Stiffness of unspecified joint, not elsewhere classified: Secondary | ICD-10-CM

## 2013-05-05 DIAGNOSIS — M5412 Radiculopathy, cervical region: Secondary | ICD-10-CM

## 2013-05-10 ENCOUNTER — Encounter (INDEPENDENT_AMBULATORY_CARE_PROVIDER_SITE_OTHER): Payer: BC Managed Care – PPO | Admitting: Physical Therapy

## 2013-05-10 DIAGNOSIS — M2560 Stiffness of unspecified joint, not elsewhere classified: Secondary | ICD-10-CM

## 2013-05-10 DIAGNOSIS — M5412 Radiculopathy, cervical region: Secondary | ICD-10-CM

## 2013-06-03 ENCOUNTER — Ambulatory Visit: Payer: BC Managed Care – PPO | Admitting: Sports Medicine

## 2013-06-07 ENCOUNTER — Ambulatory Visit (INDEPENDENT_AMBULATORY_CARE_PROVIDER_SITE_OTHER): Payer: BC Managed Care – PPO | Admitting: Sports Medicine

## 2013-06-07 VITALS — BP 149/89 | HR 70 | Ht 72.0 in | Wt 245.0 lb

## 2013-06-07 DIAGNOSIS — L2089 Other atopic dermatitis: Secondary | ICD-10-CM

## 2013-06-07 DIAGNOSIS — E785 Hyperlipidemia, unspecified: Secondary | ICD-10-CM

## 2013-06-07 DIAGNOSIS — Q828 Other specified congenital malformations of skin: Secondary | ICD-10-CM

## 2013-06-07 DIAGNOSIS — M5412 Radiculopathy, cervical region: Secondary | ICD-10-CM

## 2013-06-07 DIAGNOSIS — J45909 Unspecified asthma, uncomplicated: Secondary | ICD-10-CM

## 2013-06-07 DIAGNOSIS — M19049 Primary osteoarthritis, unspecified hand: Secondary | ICD-10-CM

## 2013-06-07 MED ORDER — TRIAMCINOLONE 0.1 % CREAM:EUCERIN CREAM 1:1: 1.0000 "application " | TOPICAL_CREAM | Freq: Two times a day (BID) | CUTANEOUS | Status: DC

## 2013-06-07 NOTE — Assessment & Plan Note (Signed)
Kenalog cream 

## 2013-06-07 NOTE — Assessment & Plan Note (Signed)
Overall no pain during the day, does get some stiffness in the morning so I have recommended he use meloxicam at bedtime. If insufficient response certainly we can consider individual interphalangeal joint injections.

## 2013-06-07 NOTE — Assessment & Plan Note (Signed)
Currently doing well after physical therapy, only gets an occasional left-sided radicular twinge. His main problem is while sleeping, he does not yet want to try any oral medications however we could start Flexeril at bedtime. He will call us if desired.

## 2013-06-07 NOTE — Progress Notes (Signed)
  Subjective:    CC: Follow up  HPI: Hand osteoarthritis: Continues to have morning stiffness, he is taking his meloxicam in the morning, during the day he has no pain, he is happy with how he's doing so far.  Left cervical radiculitis: Overall pain-free after physical therapy, only gets occasional stiffness in his neck at night, not yet ready to consider a muscle relaxer.  Skin rash: Needs refill on Kenalog cream.  Past medical history, Surgical history, Family history not pertinant except as noted below, Social history, Allergies, and medications have been entered into the medical record, reviewed, and no changes needed.   Review of Systems: No fevers, chills, night sweats, weight loss, chest pain, or shortness of breath.   Objective:    General: Well Developed, well nourished, and in no acute distress.  Neuro: Alert and oriented x3, extra-ocular muscles intact, sensation grossly intact.  HEENT: Normocephalic, atraumatic, pupils equal round reactive to light, neck supple, no masses, no lymphadenopathy, thyroid nonpalpable.  Skin: Warm and dry, only minimal contact dermatitis on his back. Cardiac: Regular rate and rhythm, no murmurs rubs or gallops, no lower extremity edema.  Respiratory: Clear to auscultation bilaterally. Not using accessory muscles, speaking in full sentences.  Impression and Recommendations:

## 2013-08-27 ENCOUNTER — Ambulatory Visit: Payer: BC Managed Care – PPO | Admitting: Sports Medicine

## 2013-09-03 ENCOUNTER — Encounter: Payer: Self-pay | Admitting: Sports Medicine

## 2013-09-03 ENCOUNTER — Ambulatory Visit (INDEPENDENT_AMBULATORY_CARE_PROVIDER_SITE_OTHER): Payer: BC Managed Care – PPO | Admitting: Sports Medicine

## 2013-09-03 ENCOUNTER — Ambulatory Visit (INDEPENDENT_AMBULATORY_CARE_PROVIDER_SITE_OTHER): Payer: BC Managed Care – PPO

## 2013-09-03 VITALS — BP 147/86 | HR 78 | Ht 72.0 in | Wt 245.0 lb

## 2013-09-03 DIAGNOSIS — Q7649 Other congenital malformations of spine, not associated with scoliosis: Secondary | ICD-10-CM

## 2013-09-03 DIAGNOSIS — M51369 Other intervertebral disc degeneration, lumbar region without mention of lumbar back pain or lower extremity pain: Secondary | ICD-10-CM | POA: Insufficient documentation

## 2013-09-03 DIAGNOSIS — M5137 Other intervertebral disc degeneration, lumbosacral region: Secondary | ICD-10-CM

## 2013-09-03 DIAGNOSIS — M51379 Other intervertebral disc degeneration, lumbosacral region without mention of lumbar back pain or lower extremity pain: Secondary | ICD-10-CM

## 2013-09-03 DIAGNOSIS — M5136 Other intervertebral disc degeneration, lumbar region: Secondary | ICD-10-CM

## 2013-09-03 DIAGNOSIS — Q762 Congenital spondylolisthesis: Secondary | ICD-10-CM

## 2013-09-03 MED ORDER — CYCLOBENZAPRINE HCL 10 MG PO TABS: ORAL_TABLET | ORAL | Status: DC

## 2013-09-03 MED ORDER — PREDNISONE 50 MG PO TABS: ORAL_TABLET | ORAL | Status: DC

## 2013-09-03 MED ORDER — MELOXICAM 15 MG PO TABS: ORAL_TABLET | ORAL | Status: DC

## 2013-09-03 NOTE — Progress Notes (Signed)
  Subjective:    CC: Low back pain  HPI: This is a very pleasant 48 year old male, he is a history of lumbar degenerative disc disease. For the past several months he said increasing pain in his low back, predominantly axial. Occasionally he'll get right-sided numbness in the legs, but his axial pain is predominately on the left. Pain is worse with sitting, flexion, moderate, persistent, no bowel or bladder dysfunction, no saddle numbness, no constitutional symptoms.  Past medical history, Surgical history, Family history not pertinant except as noted below, Social history, Allergies, and medications have been entered into the medical record, reviewed, and no changes needed.   Review of Systems: No fevers, chills, night sweats, weight loss, chest pain, or shortness of breath.   Objective:    General: Well Developed, well nourished, and in no acute distress.  Neuro: Alert and oriented x3, extra-ocular muscles intact, sensation grossly intact.  HEENT: Normocephalic, atraumatic, pupils equal round reactive to light, neck supple, no masses, no lymphadenopathy, thyroid nonpalpable.  Skin: Warm and dry, no rashes. Cardiac: Regular rate and rhythm, no murmurs rubs or gallops, no lower extremity edema.  Respiratory: Clear to auscultation bilaterally. Not using accessory muscles, speaking in full sentences. Back Exam:  Inspection: Unremarkable  Motion: Flexion 45 deg, Extension 45 deg, Side Bending to 45 deg bilaterally,  Rotation to 45 deg bilaterally  SLR laying: Negative  XSLR laying: Negative  Palpable tenderness: None. FABER: negative. Sensory change: Gross sensation intact to all lumbar and sacral dermatomes.  Reflexes: 2+ at both patellar tendons, 2+ at achilles tendons, Babinski's downgoing.  Strength at foot  Plantar-flexion: 5/5 Dorsi-flexion: 5/5 Eversion: 5/5 Inversion: 5/5  Leg strength  Quad: 5/5 Hamstring: 5/5 Hip flexor: 5/5 Hip abductors: 5/5  Gait unremarkable.  L-spine  x-rays show degenerative disc disease at L4-L5 and spondylolisthesis of L5-S1.  Impression and Recommendations:

## 2013-09-03 NOTE — Assessment & Plan Note (Signed)
Prednisone, Mobic, Flexeril, x-rays, formal PT. Return, MRI for interventional injection planning if no better. He does have a coming up July 8. We need to get him better by then.

## 2013-09-09 ENCOUNTER — Ambulatory Visit (INDEPENDENT_AMBULATORY_CARE_PROVIDER_SITE_OTHER): Payer: BC Managed Care – PPO | Admitting: Family Medicine

## 2013-09-09 ENCOUNTER — Ambulatory Visit (HOSPITAL_BASED_OUTPATIENT_CLINIC_OR_DEPARTMENT_OTHER)
Admission: RE | Admit: 2013-09-09 | Discharge: 2013-09-09 | Disposition: A | Discharge status: Home / Self Care | Payer: BC Managed Care – PPO | Source: Ambulatory Visit | Attending: Family Medicine | Admitting: Family Medicine

## 2013-09-09 ENCOUNTER — Encounter: Payer: Self-pay | Admitting: Family Medicine

## 2013-09-09 VITALS — BP 145/88 | HR 90 | Wt 245.0 lb

## 2013-09-09 DIAGNOSIS — N508 Other specified disorders of male genital organs: Secondary | ICD-10-CM

## 2013-09-09 DIAGNOSIS — N5089 Other specified disorders of the male genital organs: Secondary | ICD-10-CM

## 2013-09-09 NOTE — Progress Notes (Signed)
CC: Martin Kennedy is a 48 y.o. male is here for bump on testicle   Subjective: HPI:  Patient complains of right scrotal mass that he noticed yesterday while in the shower washing himself. He has never had this before. Nothing particularly has made it better or worse. It has not been growing since onset. He localizes it to the medial superior aspect of the right testicle. He denies any other genitourinary complaints such as dysuria, testicular swelling, testicular pain, scrotal swelling, nor penile discharge. Denies unintentional weight loss, fevers, chills, nor joint or skeletal pain. No interventions as of yet. He denies any history of recent or remote testicular trauma   Review Of Systems Outlined In HPI  Past Medical History  Diagnosis Date  . Asthma   . Hyperlipidemia     No past surgical history on file. No family history on file.  History   Social History  . Marital Status: Married    Spouse Name: N/A    Number of Children: N/A  . Years of Education: N/A   Occupational History  . Not on file.   Social History Main Topics  . Smoking status: Never Smoker   . Smokeless tobacco: Not on file  . Alcohol Use: No  . Drug Use: No  . Sexual Activity:    Other Topics Concern  . Not on file   Social History Narrative  . No narrative on file     Objective: BP 145/88  Pulse 90  Wt 245 lb (111.131 kg)  General: Alert and Oriented, No Acute Distress Lungs: Clear and comfortable work of breathing Cardiac: Regular rate and rhythm.  Genitourinary: Unremarkable penis without lesions, left testicle is smooth without nodularity, right testicle is smooth with a questionable fixed nodule on the superior medial posterior aspect which is nontender. Extremities: No peripheral edema.  Strong peripheral pulses.  Mental Status: No depression, anxiety, nor agitation. Skin: Warm and dry.  Assessment & Plan: [name] was seen today for bump on testicle.  Diagnoses and associated orders  for this visit:  Testicular mass - US Scrotum; Future    Testicular mass: Sending to ultrasound at [facility] to rule out oncologic process. Plan will be ultimately dictated based on the above results  Return if symptoms worsen or fail to improve.

## 2013-09-14 ENCOUNTER — Ambulatory Visit (INDEPENDENT_AMBULATORY_CARE_PROVIDER_SITE_OTHER): Payer: BC Managed Care – PPO | Admitting: Physical Therapy

## 2013-09-14 DIAGNOSIS — M545 Low back pain, unspecified: Secondary | ICD-10-CM

## 2013-09-14 DIAGNOSIS — R5381 Other malaise: Secondary | ICD-10-CM

## 2013-09-14 DIAGNOSIS — M2560 Stiffness of unspecified joint, not elsewhere classified: Secondary | ICD-10-CM

## 2013-09-14 DIAGNOSIS — M5137 Other intervertebral disc degeneration, lumbosacral region: Secondary | ICD-10-CM

## 2013-09-14 DIAGNOSIS — M25559 Pain in unspecified hip: Secondary | ICD-10-CM

## 2013-09-16 ENCOUNTER — Encounter: Payer: BC Managed Care – PPO | Admitting: Physical Therapy

## 2013-09-16 DIAGNOSIS — M2560 Stiffness of unspecified joint, not elsewhere classified: Secondary | ICD-10-CM

## 2013-09-16 DIAGNOSIS — M545 Low back pain, unspecified: Secondary | ICD-10-CM

## 2013-09-16 DIAGNOSIS — M5137 Other intervertebral disc degeneration, lumbosacral region: Secondary | ICD-10-CM

## 2013-09-16 DIAGNOSIS — R5381 Other malaise: Secondary | ICD-10-CM

## 2013-09-16 DIAGNOSIS — M25559 Pain in unspecified hip: Secondary | ICD-10-CM

## 2013-09-20 ENCOUNTER — Encounter (INDEPENDENT_AMBULATORY_CARE_PROVIDER_SITE_OTHER): Payer: BC Managed Care – PPO | Admitting: Physical Therapy

## 2013-09-20 DIAGNOSIS — M545 Low back pain, unspecified: Secondary | ICD-10-CM

## 2013-09-20 DIAGNOSIS — M25559 Pain in unspecified hip: Secondary | ICD-10-CM

## 2013-09-20 DIAGNOSIS — R5381 Other malaise: Secondary | ICD-10-CM

## 2013-09-20 DIAGNOSIS — M2560 Stiffness of unspecified joint, not elsewhere classified: Secondary | ICD-10-CM

## 2013-09-20 DIAGNOSIS — M5137 Other intervertebral disc degeneration, lumbosacral region: Secondary | ICD-10-CM

## 2013-09-23 ENCOUNTER — Encounter: Payer: BC Managed Care – PPO | Admitting: Physical Therapy

## 2013-09-24 ENCOUNTER — Ambulatory Visit (INDEPENDENT_AMBULATORY_CARE_PROVIDER_SITE_OTHER): Payer: BC Managed Care – PPO | Admitting: Sports Medicine

## 2013-09-24 ENCOUNTER — Encounter (INDEPENDENT_AMBULATORY_CARE_PROVIDER_SITE_OTHER): Payer: BC Managed Care – PPO

## 2013-09-24 ENCOUNTER — Encounter: Payer: Self-pay | Admitting: Sports Medicine

## 2013-09-24 VITALS — BP 142/90 | HR 75 | Ht 72.0 in | Wt 250.0 lb

## 2013-09-24 DIAGNOSIS — M51369 Other intervertebral disc degeneration, lumbar region without mention of lumbar back pain or lower extremity pain: Secondary | ICD-10-CM

## 2013-09-24 DIAGNOSIS — R5381 Other malaise: Secondary | ICD-10-CM

## 2013-09-24 DIAGNOSIS — M51379 Other intervertebral disc degeneration, lumbosacral region without mention of lumbar back pain or lower extremity pain: Secondary | ICD-10-CM

## 2013-09-24 DIAGNOSIS — M5137 Other intervertebral disc degeneration, lumbosacral region: Secondary | ICD-10-CM

## 2013-09-24 DIAGNOSIS — M5136 Other intervertebral disc degeneration, lumbar region: Secondary | ICD-10-CM

## 2013-09-24 NOTE — Assessment & Plan Note (Signed)
He really has no pain, only numbness in the right leg. At this point he is happy with where he sat in the water per see any further. Return as needed. Next step would be an MRI for interventional planning.

## 2013-09-24 NOTE — Progress Notes (Signed)
  Subjective:    CC: Followup  HPI: Lumbar radiculopathy: Recently treated with steroids, NSAIDs, muscle relaxers. He tells me that he has no pain, only has a numbness on the lateral aspect of his right lower leg. Symptoms are mild, and he is okay with the severity, does not desire any further treatment.  Past medical history, Surgical history, Family history not pertinant except as noted below, Social history, Allergies, and medications have been entered into the medical record, reviewed, and no changes needed.   Review of Systems: No fevers, chills, night sweats, weight loss, chest pain, or shortness of breath.   Objective:    General: Well Developed, well nourished, and in no acute distress.  Neuro: Alert and oriented x3, extra-ocular muscles intact, sensation grossly intact.  HEENT: Normocephalic, atraumatic, pupils equal round reactive to light, neck supple, no masses, no lymphadenopathy, thyroid nonpalpable.  Skin: Warm and dry, no rashes. Cardiac: Regular rate and rhythm, no murmurs rubs or gallops, no lower extremity edema.  Respiratory: Clear to auscultation bilaterally. Not using accessory muscles, speaking in full sentences.   Impression and Recommendations:    I spent 25 minutes with this patient, greater than 50% was face-to-face time counseling and description of the anatomy related to lumbar degenerative disease and spondylolisthesis.

## 2013-09-28 ENCOUNTER — Encounter (INDEPENDENT_AMBULATORY_CARE_PROVIDER_SITE_OTHER): Payer: BC Managed Care – PPO | Admitting: Physical Therapy

## 2013-09-28 DIAGNOSIS — M5137 Other intervertebral disc degeneration, lumbosacral region: Secondary | ICD-10-CM

## 2013-09-28 DIAGNOSIS — R5381 Other malaise: Secondary | ICD-10-CM

## 2013-09-28 DIAGNOSIS — M2560 Stiffness of unspecified joint, not elsewhere classified: Secondary | ICD-10-CM

## 2013-09-28 DIAGNOSIS — M25559 Pain in unspecified hip: Secondary | ICD-10-CM

## 2013-09-28 DIAGNOSIS — M545 Low back pain, unspecified: Secondary | ICD-10-CM

## 2013-09-28 DIAGNOSIS — M51379 Other intervertebral disc degeneration, lumbosacral region without mention of lumbar back pain or lower extremity pain: Secondary | ICD-10-CM

## 2013-09-30 ENCOUNTER — Encounter (INDEPENDENT_AMBULATORY_CARE_PROVIDER_SITE_OTHER): Payer: BC Managed Care – PPO | Admitting: Physical Therapy

## 2013-09-30 DIAGNOSIS — M51379 Other intervertebral disc degeneration, lumbosacral region without mention of lumbar back pain or lower extremity pain: Secondary | ICD-10-CM

## 2013-09-30 DIAGNOSIS — M2560 Stiffness of unspecified joint, not elsewhere classified: Secondary | ICD-10-CM

## 2013-09-30 DIAGNOSIS — M545 Low back pain, unspecified: Secondary | ICD-10-CM

## 2013-09-30 DIAGNOSIS — R5381 Other malaise: Secondary | ICD-10-CM

## 2013-09-30 DIAGNOSIS — M25559 Pain in unspecified hip: Secondary | ICD-10-CM

## 2013-09-30 DIAGNOSIS — M5137 Other intervertebral disc degeneration, lumbosacral region: Secondary | ICD-10-CM

## 2013-10-05 ENCOUNTER — Encounter (INDEPENDENT_AMBULATORY_CARE_PROVIDER_SITE_OTHER): Payer: BC Managed Care – PPO | Admitting: Physical Therapy

## 2013-10-05 DIAGNOSIS — M545 Low back pain, unspecified: Secondary | ICD-10-CM

## 2013-10-05 DIAGNOSIS — R5381 Other malaise: Secondary | ICD-10-CM

## 2013-10-05 DIAGNOSIS — M5137 Other intervertebral disc degeneration, lumbosacral region: Secondary | ICD-10-CM

## 2013-10-05 DIAGNOSIS — M51379 Other intervertebral disc degeneration, lumbosacral region without mention of lumbar back pain or lower extremity pain: Secondary | ICD-10-CM

## 2013-10-05 DIAGNOSIS — M2560 Stiffness of unspecified joint, not elsewhere classified: Secondary | ICD-10-CM

## 2013-10-05 DIAGNOSIS — M25559 Pain in unspecified hip: Secondary | ICD-10-CM

## 2014-02-03 ENCOUNTER — Ambulatory Visit (INDEPENDENT_AMBULATORY_CARE_PROVIDER_SITE_OTHER): Payer: BC Managed Care – PPO | Admitting: Sports Medicine

## 2014-02-03 ENCOUNTER — Encounter: Payer: Self-pay | Admitting: Sports Medicine

## 2014-02-03 VITALS — BP 129/75 | HR 76 | Ht 72.0 in | Wt 247.0 lb

## 2014-02-03 DIAGNOSIS — M5136 Other intervertebral disc degeneration, lumbar region: Secondary | ICD-10-CM | POA: Diagnosis not present

## 2014-02-03 DIAGNOSIS — M5412 Radiculopathy, cervical region: Secondary | ICD-10-CM

## 2014-02-03 DIAGNOSIS — M51369 Other intervertebral disc degeneration, lumbar region without mention of lumbar back pain or lower extremity pain: Secondary | ICD-10-CM

## 2014-02-03 DIAGNOSIS — E669 Obesity, unspecified: Secondary | ICD-10-CM | POA: Diagnosis not present

## 2014-02-03 MED ORDER — PHENTERMINE HCL 37.5 MG PO CAPS: ORAL_CAPSULE | ORAL | Status: DC

## 2014-02-03 NOTE — Assessment & Plan Note (Signed)
Pain is now most likely facet mediated. MRI for interventional injection planning. Return to go over MRI results.

## 2014-02-03 NOTE — Assessment & Plan Note (Signed)
With left-sided L5 and S1 radicular symptoms. MRI for interventional injection planning. Return to go over MRI results.

## 2014-02-03 NOTE — Progress Notes (Signed)
  Subjective:    CC: Follow-up  HPI: Neck pain: Initially resolved with conservative measures but unfortunately is having recurrence pain, moderate, persistent. Pain is worse with neck extension and better with flexion.  Lumbar degenerative disc disease: Left-sided L5 versus S1 numbness, worse with sitting in flexion and Valsalva. He has had x-rays and conservative measures and therapy without any improvement.  Obesity: Amenable to trying weight loss medication.  Past medical history, Surgical history, Family history not pertinant except as noted below, Social history, Allergies, and medications have been entered into the medical record, reviewed, and no changes needed.   Review of Systems: No fevers, chills, night sweats, weight loss, chest pain, or shortness of breath.   Objective:    General: Well Developed, well nourished, and in no acute distress.  Neuro: Alert and oriented x3, extra-ocular muscles intact, sensation grossly intact.  HEENT: Normocephalic, atraumatic, pupils equal round reactive to light, neck supple, no masses, no lymphadenopathy, thyroid nonpalpable.  Skin: Warm and dry, no rashes. Cardiac: Regular rate and rhythm, no murmurs rubs or gallops, no lower extremity edema.  Respiratory: Clear to auscultation bilaterally. Not using accessory muscles, speaking in full sentences.  Impression and Recommendations:

## 2014-02-03 NOTE — Assessment & Plan Note (Signed)
Phentermine. Return monthly for weight checks and refills.

## 2014-02-04 ENCOUNTER — Telehealth: Payer: Self-pay | Admitting: *Deleted

## 2014-02-04 NOTE — Telephone Encounter (Signed)
No prior auth required for MRI lumbar and cervical. Spoke with Carlena [facility]air at [employer]. Radiology notified. Corliss SkainsJamie Arshia Spellman, RMA

## 2014-02-07 ENCOUNTER — Other Ambulatory Visit: Payer: BC Managed Care – PPO

## 2014-02-10 ENCOUNTER — Telehealth: Payer: Self-pay | Admitting: *Deleted

## 2014-02-10 NOTE — Telephone Encounter (Signed)
MRI prior auth [id number] valid 11/5-12/4/15 for [id number]. Radiology notified. Corliss SkainsJamie Norlan Rann, CMA

## 2014-02-14 ENCOUNTER — Other Ambulatory Visit: Payer: BC Managed Care – PPO

## 2014-02-14 ENCOUNTER — Encounter: Payer: Self-pay | Admitting: Sports Medicine

## 2014-02-14 ENCOUNTER — Ambulatory Visit (INDEPENDENT_AMBULATORY_CARE_PROVIDER_SITE_OTHER): Payer: BC Managed Care – PPO | Admitting: Sports Medicine

## 2014-02-14 VITALS — BP 128/83 | HR 90 | Ht 72.0 in | Wt 241.0 lb

## 2014-02-14 DIAGNOSIS — M5136 Other intervertebral disc degeneration, lumbar region: Secondary | ICD-10-CM | POA: Diagnosis not present

## 2014-02-14 DIAGNOSIS — M5412 Radiculopathy, cervical region: Secondary | ICD-10-CM

## 2014-02-14 DIAGNOSIS — M51369 Other intervertebral disc degeneration, lumbar region without mention of lumbar back pain or lower extremity pain: Secondary | ICD-10-CM

## 2014-02-14 NOTE — Progress Notes (Signed)
   Subjective:    CC: follow-up  HPI: Neck pain: Bilateral C6 radicular symptoms, cervical spine MRI results will be dictated below, his pain really is not that bad. He is not yet ready to consider intervention.  Lumbar radiculopathy: Bilateral, posterior thighs, MRI results will be dictated below, he is amenable to start with intervention, after a recent Valsalva he now has persistent and severe midline low back pain. No bowel or bladder dysfunction or saddle numbness.  Obesity: Has already lost 6 pounds in 2 weeks.  Past medical history, Surgical history, Family history not pertinant except as noted below, Social history, Allergies, and medications have been entered into the medical record, reviewed, and no changes needed.   Review of Systems: No fevers, chills, night sweats, weight loss, chest pain, or shortness of breath.   Objective:    General: Well Developed, well nourished, and in no acute distress.  Neuro: Alert and oriented x3, extra-ocular muscles intact, sensation grossly intact.  HEENT: Normocephalic, atraumatic, pupils equal round reactive to light, neck supple, no masses, no lymphadenopathy, thyroid nonpalpable.  Skin: Warm and dry, no rashes. Cardiac: Regular rate and rhythm, no murmurs rubs or gallops, no lower extremity edema.  Respiratory: Clear to auscultation bilaterally. Not using accessory muscles, speaking in full sentences.  Images are not available for my review,The following is based on radiologist's review. Cervical spine MRI shows C3-C4 left-sided uncovertebral spurring and C4-C5 left-sided facet arthritis with foraminal stenosis at both levels.  Lumbar spine MRI shows what appear to be prior laminotomies at L4-5 and L5-S1, L5-S1 protrusion shows no foraminal or lateral recess stenosis, there is a reherniation at the L4-L5 level with likely irritation of the right L5 nerve root.  Impression and Recommendations:

## 2014-02-14 NOTE — Assessment & Plan Note (Addendum)
MRI with L4-L5 disc protrusion on the right, patient does have bilateral symptoms. Unfortunately I do not have the images to review, he is going to drop them off. From my review of the images we will order an epidural.  Personal review of the lumbar spine MRI shows a large disc extrusion at the L4-L5 level with a right paracentral component. We are going to order a right-sided L4-L5 interlaminar epidural. Patient will pick up his CD.

## 2014-02-14 NOTE — Assessment & Plan Note (Signed)
Overall pain is not too bad. MRI does show mild left foraminal stenosis at C3-C4 due to uncovertebral spurring and left foraminal stenosis at C4-5 due to facet arthritis. Not yet ready for an epidural here.

## 2014-02-16 ENCOUNTER — Telehealth: Payer: Self-pay | Admitting: Sports Medicine

## 2014-02-16 NOTE — Telephone Encounter (Signed)
Doesn't really change the plan, the medication will spread upward.  I am happy to order a new MRI if he is ok with this however a new disc herniation would be treated like we did from the beginning, with meds and PT rather than immediate epidural.  We should still proceed with the epidural at the previously decided level but he should also let me know if he wants a new MRI.

## 2014-02-16 NOTE — Telephone Encounter (Signed)
I will call Onalee HuaDavid but I did tell him this when he was in the office that he would be injected and the medication would travel in his spinal fluid just not stay in one contained area. I am doubtful he will want another MRI due to he had to pay $1000 out of pocket for this one he just had. Corliss SkainsJamie Samaria Anes, RMA

## 2014-02-16 NOTE — Telephone Encounter (Signed)
Martin Kennedy came to office today to p/u MRI disk and states that about 2 weeks ago he had a "violent" sneeze and now is experiencing some pain in his back higher than the scheduled epidural tomorrow. He said that this incident happened prior to the MRI imaging. He said that it is pressure not pain. Please advise. Corliss SkainsJamie Collins Dimaria, CMA

## 2014-02-17 ENCOUNTER — Ambulatory Visit
Admission: RE | Admit: 2014-02-17 | Discharge: 2014-02-17 | Disposition: A | Discharge status: Home / Self Care | Payer: BC Managed Care – PPO | Source: Ambulatory Visit | Attending: Sports Medicine | Admitting: Sports Medicine

## 2014-02-17 MED ORDER — IOHEXOL 180 MG/ML  SOLN
1.0000 mL | Freq: Once | INTRAMUSCULAR | Status: AC | PRN
  Administered 2014-02-17: 1 mL via EPIDURAL

## 2014-02-17 MED ORDER — METHYLPREDNISOLONE ACETATE 40 MG/ML INJ SUSP (RADIOLOG
120.0000 mg | Freq: Once | INTRAMUSCULAR | Status: AC
  Administered 2014-02-17: 120 mg via EPIDURAL

## 2014-02-17 NOTE — Discharge Instructions (Signed)

## 2014-02-23 ENCOUNTER — Encounter: Payer: Self-pay | Admitting: Sports Medicine

## 2014-03-01 ENCOUNTER — Ambulatory Visit (INDEPENDENT_AMBULATORY_CARE_PROVIDER_SITE_OTHER): Payer: BC Managed Care – PPO | Admitting: Sports Medicine

## 2014-03-01 ENCOUNTER — Encounter: Payer: Self-pay | Admitting: Sports Medicine

## 2014-03-01 ENCOUNTER — Telehealth: Payer: Self-pay | Admitting: *Deleted

## 2014-03-01 VITALS — BP 139/79 | HR 94 | Ht 72.0 in | Wt 235.0 lb

## 2014-03-01 DIAGNOSIS — M5134 Other intervertebral disc degeneration, thoracic region: Secondary | ICD-10-CM | POA: Diagnosis not present

## 2014-03-01 DIAGNOSIS — M51369 Other intervertebral disc degeneration, lumbar region without mention of lumbar back pain or lower extremity pain: Secondary | ICD-10-CM

## 2014-03-01 DIAGNOSIS — E669 Obesity, unspecified: Secondary | ICD-10-CM | POA: Diagnosis not present

## 2014-03-01 DIAGNOSIS — M5136 Other intervertebral disc degeneration, lumbar region: Secondary | ICD-10-CM | POA: Diagnosis not present

## 2014-03-01 MED ORDER — PHENTERMINE HCL 37.5 MG PO CAPS: ORAL_CAPSULE | ORAL | Status: DC

## 2014-03-01 NOTE — Telephone Encounter (Signed)
No prior auth for CT required. Corliss SkainsJamie Wal[place]ga Hudman, CMA

## 2014-03-01 NOTE — Assessment & Plan Note (Signed)
Good the temporary response to an L4-L5 epidural. At this point he has persistent weakness, and does not desire any further epidurals. He also has some pain in the lower thoracic/midthoracic spine. Referral to Dr. Yevette Edwardsumonski, CT thoracic spine (can't afford MRI t-spine) Return as needed.

## 2014-03-01 NOTE — Assessment & Plan Note (Signed)
Refilling phentermine, return in one month.

## 2014-03-01 NOTE — Progress Notes (Signed)
  Subjective:    CC: follow-up  HPI: Lumbar degenerative disc disease: With an L4-L5 disc protrusion, he is post-an L4-L5 interlaminar epidural, he had a fantastic initial response with complete pain relief the day of the injection. Now he continues to have some pain but it is improved. Unfortunately still continues to have some heaviness in both of his legs. He also endorses a pain somewhat more proximal in the lower thoracic spine, worse with sitting, flexion, and Valsalva. It started with a sneeze.  Obesity: Continues to lose weight, has lost approximately 12-15 pounds so far in his first month on the medication.  Past medical history, Surgical history, Family history not pertinant except as noted below, Social history, Allergies, and medications have been entered into the medical record, reviewed, and no changes needed.   Review of Systems: No fevers, chills, night sweats, weight loss, chest pain, or shortness of breath.   Objective:    General: Well Developed, well nourished, and in no acute distress.  Neuro: Alert and oriented x3, extra-ocular muscles intact, sensation grossly intact.  HEENT: Normocephalic, atraumatic, pupils equal round reactive to light, neck supple, no masses, no lymphadenopathy, thyroid nonpalpable.  Skin: Warm and dry, no rashes. Cardiac: Regular rate and rhythm, no murmurs rubs or gallops, no lower extremity edema.  Respiratory: Clear to auscultation bilaterally. Not using accessory muscles, speaking in full sentences.  Impression and Recommendations:

## 2014-03-02 ENCOUNTER — Ambulatory Visit (INDEPENDENT_AMBULATORY_CARE_PROVIDER_SITE_OTHER): Payer: BC Managed Care – PPO

## 2014-03-02 DIAGNOSIS — M5134 Other intervertebral disc degeneration, thoracic region: Secondary | ICD-10-CM

## 2014-03-29 ENCOUNTER — Encounter: Payer: Self-pay | Admitting: Sports Medicine

## 2014-03-29 ENCOUNTER — Ambulatory Visit (INDEPENDENT_AMBULATORY_CARE_PROVIDER_SITE_OTHER): Payer: BC Managed Care – PPO | Admitting: Sports Medicine

## 2014-03-29 VITALS — BP 120/80 | HR 90 | Ht 72.0 in | Wt 228.0 lb

## 2014-03-29 DIAGNOSIS — E669 Obesity, unspecified: Secondary | ICD-10-CM

## 2014-03-29 DIAGNOSIS — M5136 Other intervertebral disc degeneration, lumbar region: Secondary | ICD-10-CM

## 2014-03-29 DIAGNOSIS — M51369 Other intervertebral disc degeneration, lumbar region without mention of lumbar back pain or lower extremity pain: Secondary | ICD-10-CM

## 2014-03-29 MED ORDER — PHENTERMINE HCL 37.5 MG PO CAPS: ORAL_CAPSULE | ORAL | Status: DC

## 2014-03-29 NOTE — Assessment & Plan Note (Signed)
Over 20 pound weight loss so far. Refilling phentermine. Return in 5 weeks, he does have enough medication to last him this long.

## 2014-03-29 NOTE — Progress Notes (Signed)
  Subjective:    CC: Follow-up  HPI: Obesity: Martin Kennedy has now lost over 20 pounds in 2 months, happy with the medication.  Lumbar degenerative disc disease: Did see Dr. Yevette Edwardsumonski, he did have a good bit temporary response to his epidural making him a surgical candidate, is going to go through with lumbar microdiscectomy with Dr. Yevette Edwardsumonski.  Past medical history, Surgical history, Family history not pertinant except as noted below, Social history, Allergies, and medications have been entered into the medical record, reviewed, and no changes needed.   Review of Systems: No fevers, chills, night sweats, weight loss, chest pain, or shortness of breath.   Objective:    General: Well Developed, well nourished, and in no acute distress.  Neuro: Alert and oriented x3, extra-ocular muscles intact, sensation grossly intact.  HEENT: Normocephalic, atraumatic, pupils equal round reactive to light, neck supple, no masses, no lymphadenopathy, thyroid nonpalpable.  Skin: Warm and dry, no rashes. Cardiac: Regular rate and rhythm, no murmurs rubs or gallops, no lower extremity edema.  Respiratory: Clear to auscultation bilaterally. Not using accessory muscles, speaking in full sentences.  Impression and Recommendations:

## 2014-03-29 NOTE — Assessment & Plan Note (Signed)
Is going to be set up for lumbar microdiscectomy with Dr. Yevette Edwardsumonski.

## 2014-05-03 ENCOUNTER — Ambulatory Visit: Payer: BC Managed Care – PPO | Admitting: Sports Medicine

## 2014-05-23 ENCOUNTER — Ambulatory Visit: Payer: Self-pay | Admitting: Sports Medicine

## 2014-05-24 ENCOUNTER — Ambulatory Visit (INDEPENDENT_AMBULATORY_CARE_PROVIDER_SITE_OTHER): Payer: BLUE CROSS/BLUE SHIELD | Admitting: Sports Medicine

## 2014-05-24 ENCOUNTER — Encounter: Payer: Self-pay | Admitting: Sports Medicine

## 2014-05-24 VITALS — BP 141/87 | HR 97 | Ht 72.0 in | Wt 222.0 lb

## 2014-05-24 DIAGNOSIS — M5136 Other intervertebral disc degeneration, lumbar region: Secondary | ICD-10-CM | POA: Diagnosis not present

## 2014-05-24 DIAGNOSIS — M51369 Other intervertebral disc degeneration, lumbar region without mention of lumbar back pain or lower extremity pain: Secondary | ICD-10-CM

## 2014-05-24 DIAGNOSIS — E669 Obesity, unspecified: Secondary | ICD-10-CM

## 2014-05-24 MED ORDER — PHENTERMINE HCL 37.5 MG PO CAPS: ORAL_CAPSULE | ORAL | Status: DC

## 2014-05-24 NOTE — Progress Notes (Signed)
  Subjective:    CC: follow-up  HPI: Obesity: Good continued weight loss, despite back surgery. He will be entering his fourth month of phentermine.  Lumbar degenerative disc disease: He is post-microdiscectomy on the left at L4-L5, overall doing well, still has some weakness and heaviness in both legs, he understands that it may take time.  No fevers, chills, night sweats, weight loss, bowel or bladder dysfunction or saddle numbness. No headaches.  Past medical history, Surgical history, Family history not pertinant except as noted below, Social history, Allergies, and medications have been entered into the medical record, reviewed, and no changes needed.   Review of Systems: No fevers, chills, night sweats, weight loss, chest pain, or shortness of breath.   Objective:    General: Well Developed, well nourished, and in no acute distress.  Neuro: Alert and oriented x3, extra-ocular muscles intact, sensation grossly intact.  HEENT: Normocephalic, atraumatic, pupils equal round reactive to light, neck supple, no masses, no lymphadenopathy, thyroid nonpalpable.  Skin: Warm and dry, no rashes. Cardiac: Regular rate and rhythm, no murmurs rubs or gallops, no lower extremity edema.  Respiratory: Clear to auscultation bilaterally. Not using accessory muscles, speaking in full sentences.  I did review his postoperative MRI which showed good decompression of the nerve root, there did appear to be some tissue coming into contact with the nerve root however this early from surgery I don't think it means anything.  Impression and Recommendations:

## 2014-05-24 NOTE — Assessment & Plan Note (Signed)
Continued good weight loss, started at 250 pounds, today is 222 pounds. Refilling phentermine, we will do 3 additional months. He will return to see me when he is near completion of his current prescription.

## 2014-05-24 NOTE — Assessment & Plan Note (Signed)
Post L4-5 laminotomy with microdiscectomy, still having some heaviness in both legs intermittently. I did review his postoperative MRI which does show good decompression, there does appear to be contact of what appears to be the dural sac with the nerve root, he is only a few weeks out, and I think simply need to give this more time. He will follow this up with Dr. Yevette Edwardsumonski with spine surgery.

## 2014-06-28 ENCOUNTER — Ambulatory Visit: Payer: BLUE CROSS/BLUE SHIELD | Admitting: Sports Medicine

## 2014-07-08 ENCOUNTER — Ambulatory Visit (INDEPENDENT_AMBULATORY_CARE_PROVIDER_SITE_OTHER): Payer: BLUE CROSS/BLUE SHIELD | Admitting: Sports Medicine

## 2014-07-08 ENCOUNTER — Encounter: Payer: Self-pay | Admitting: Sports Medicine

## 2014-07-08 VITALS — BP 128/70 | HR 72 | Wt 228.0 lb

## 2014-07-08 DIAGNOSIS — M5136 Other intervertebral disc degeneration, lumbar region: Secondary | ICD-10-CM | POA: Diagnosis not present

## 2014-07-08 DIAGNOSIS — E669 Obesity, unspecified: Secondary | ICD-10-CM

## 2014-07-08 DIAGNOSIS — M51369 Other intervertebral disc degeneration, lumbar region without mention of lumbar back pain or lower extremity pain: Secondary | ICD-10-CM

## 2014-07-08 MED ORDER — PHENTERMINE HCL 37.5 MG PO CAPS: ORAL_CAPSULE | ORAL | Status: DC

## 2014-07-08 NOTE — Assessment & Plan Note (Signed)
Unfortunately gained a bit of weight.  Refilling phentermine as we enter the fifth month, return in one month for a weight check and refills.

## 2014-07-08 NOTE — Assessment & Plan Note (Signed)
Back pain is improved significantly since surgery however continues to have some radicular symptoms into the entire foot. This is bilateral and suggestive more of peripheral neuropathy. He will increase his gabapentin as directed and I am going to give him some Lyrica samples to use on days he does not use gabapentin. He would like to not do any further testing or evaluation for 6 months. I do think we need to do some blood testing for peripheral neuropathy as well as a nerve conduction and electromyography in 6 months.

## 2014-07-08 NOTE — Progress Notes (Signed)
  Subjective:    CC: Follow-up  HPI: Obesity: Has unfortunately run out of pills, it has been 6 weeks since his last visit, amenable to continue phentermine.  Lumbar degenerative disc disease: Post L4-5 discectomy, unfortunately still has bilateral paresthesias and numbness in no particular nerve distribution, he has not had a nerve conduction study, and desires that we do no further intervention or testing for 6 months. He is only doing 300 mg of gabapentin 3 times a day.  Past medical history, Surgical history, Family history not pertinant except as noted below, Social history, Allergies, and medications have been entered into the medical record, reviewed, and no changes needed.   Review of Systems: No fevers, chills, night sweats, weight loss, chest pain, or shortness of breath.   Objective:    General: Well Developed, well nourished, and in no acute distress.  Neuro: Alert and oriented x3, extra-ocular muscles intact, sensation grossly intact.  HEENT: Normocephalic, atraumatic, pupils equal round reactive to light, neck supple, no masses, no lymphadenopathy, thyroid nonpalpable.  Skin: Warm and dry, no rashes. Cardiac: Regular rate and rhythm, no murmurs rubs or gallops, no lower extremity edema.  Respiratory: Clear to auscultation bilaterally. Not using accessory muscles, speaking in full sentences.  Impression and Recommendations:    I spent 40 minutes with this patient, greater than 50% was face-to-face time counseling regarding the above diagnoses.

## 2014-08-04 ENCOUNTER — Ambulatory Visit: Payer: BLUE CROSS/BLUE SHIELD | Admitting: Sports Medicine

## 2015-06-14 IMAGING — CT CT T SPINE W/O CM
1 of 6 series · 3 of 14 positions shown, 4 images · non-contrast
Comparison: None.

CLINICAL DATA: 42-year-old male with a history of chronic mid back
pain. Greater than 10 years. No history of injury.

EXAM:
CT THORACIC SPINE WITHOUT CONTRAST
TECHNIQUE: Multidetector CT imaging of the thoracic spine was performed without
intravenous contrast administration. Multiplanar CT image
reconstructions were also generated.

[Series 2: t spine bone · axial · 0.35mm/px · z∈[-418,-30]mm · 3 of 156 slices shown, 4 images]
[im 1/156  soft-tissue]
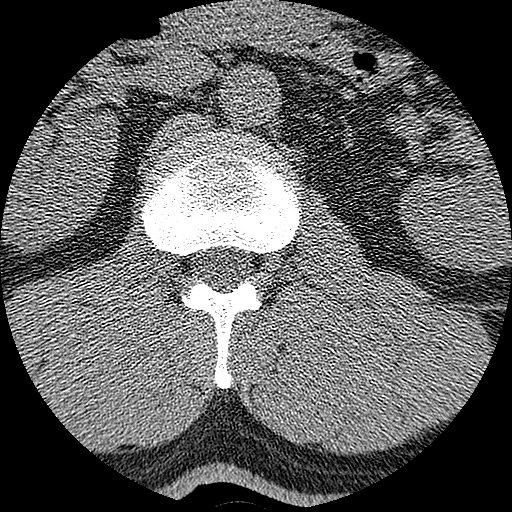
[im 1/156  bone]
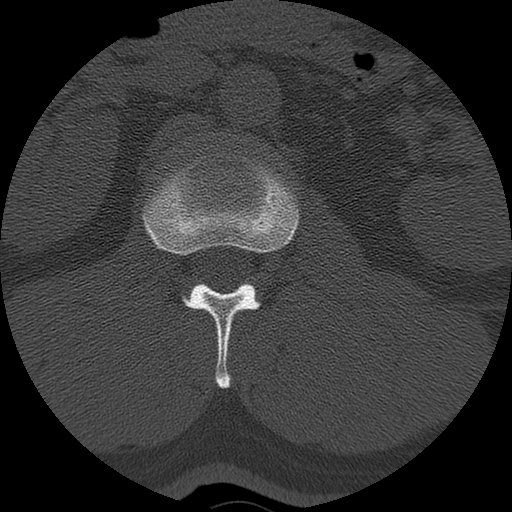
[im 78/156  bone]
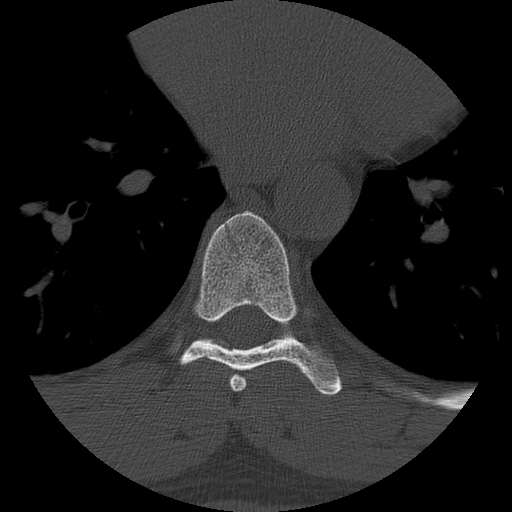
[im 156/156  bone]
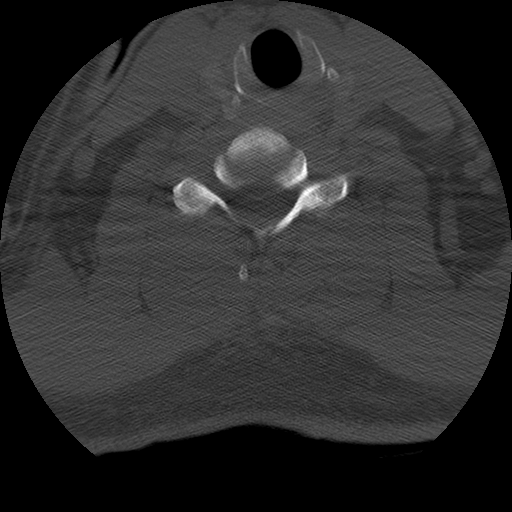

[3 of 14 positions shown; findings below may reference images not displayed]

FINDINGS: Anatomic alignment of the thoracic vertebral bodies maintain. No
anterolisthesis, retrolisthesis, subluxation.

Vertebral body heights maintained.

No acute fracture line identified.

No bony canal narrowing.

Partial calcified posterior disc protrusion at the level of T8-T[DATE] contact the anterior thecal sac.

Unremarkable appearance of the thoracic inlet/ thyroid tissue.

No mediastinal adenopathy.

Calcifications in the distribution of the left main coronary artery.

Unremarkable appearance of the visualized lungs.
IMPRESSION: No acute fracture or malalignment of the thoracic spine.

Partially calcified central disc protrusion at T8-T9 without
significant canal narrowing. This disc protrusion may contact the
anterior thecal sac.

Calcifications in the distribution of the left main coronary artery.

## 2015-07-03 ENCOUNTER — Other Ambulatory Visit: Payer: Self-pay | Admitting: Orthopedic Surgery

## 2015-07-03 DIAGNOSIS — R200 Anesthesia of skin: Secondary | ICD-10-CM

## 2015-07-12 ENCOUNTER — Ambulatory Visit
Admission: RE | Admit: 2015-07-12 | Discharge: 2015-07-12 | Disposition: A | Discharge status: Home / Self Care | Payer: BLUE CROSS/BLUE SHIELD | Source: Ambulatory Visit | Attending: Orthopedic Surgery | Admitting: Orthopedic Surgery

## 2015-07-12 ENCOUNTER — Other Ambulatory Visit: Payer: Self-pay | Admitting: Orthopedic Surgery

## 2015-07-12 DIAGNOSIS — R200 Anesthesia of skin: Secondary | ICD-10-CM

## 2015-07-12 DIAGNOSIS — M545 Low back pain: Secondary | ICD-10-CM | POA: Diagnosis not present

## 2015-07-12 MED ORDER — METHYLPREDNISOLONE ACETATE 40 MG/ML INJ SUSP (RADIOLOG
120.0000 mg | Freq: Once | INTRAMUSCULAR | Status: AC
  Administered 2015-07-12: 120 mg via EPIDURAL

## 2015-07-12 MED ORDER — IOHEXOL 180 MG/ML  SOLN
1.0000 mL | Freq: Once | INTRAMUSCULAR | Status: AC | PRN
  Administered 2015-07-12: 1 mL via EPIDURAL

## 2015-07-12 NOTE — Discharge Instructions (Signed)

## 2015-09-11 DIAGNOSIS — M542 Cervicalgia: Secondary | ICD-10-CM | POA: Diagnosis not present

## 2015-10-18 ENCOUNTER — Encounter: Payer: Self-pay | Admitting: *Deleted

## 2015-10-18 ENCOUNTER — Emergency Department
Admission: EM | Admit: 2015-10-18 | Discharge: 2015-10-18 | Disposition: A | Discharge status: Home / Self Care | Payer: BLUE CROSS/BLUE SHIELD | Source: Home / Self Care | Attending: Family Medicine | Admitting: Family Medicine

## 2015-10-18 DIAGNOSIS — H538 Other visual disturbances: Secondary | ICD-10-CM | POA: Diagnosis not present

## 2015-10-18 NOTE — ED Provider Notes (Signed)
CSN: [id number]     Arrival date & time 10/18/15  1812 History   First MD Initiated Contact with Patient 10/18/15 1836     Chief Complaint  Patient presents with  . Blurred Vision      HPI Comments: Patient reports that two days ago he experienced slight drainage from his right eye without foreign body sensation, redness, or discomfort but noticed decrease in his right eye vision.  He later noticed a vague "shimmering" sensation that has now resolved.  He states that he occasionally experiences "floaters" in his right eye.  He also noted some vague decreased vision in his left eye today, now resolved.  No headache.  No localized neurologic symptoms otherwise.  His eyes now feel normal.  No history of diabetes.  Patient is a 50 y.o. male presenting with eye problem. The history is provided by the patient.  Eye Problem Location:  Both Quality: "shimmering" Severity:  Mild Onset quality:  Sudden Duration:  2 days Timing:  Intermittent Progression:  Resolved Chronicity:  New Context: not chemical exposure, not contact lens problem, not direct trauma, not foreign body, not using machinery, not scratch and not smoke exposure   Relieved by:  Nothing Worsened by:  Nothing tried Ineffective treatments:  None tried Associated symptoms: blurred vision, decreased vision, discharge, double vision, foreign body sensation and scotomas   Associated symptoms: no crusting, no facial rash, no headaches, no inflammation, no itching, no nausea, no numbness, no photophobia, no redness, no swelling, no tearing and no tingling   Risk factors: no conjunctival hemorrhage, not exposed to pinkeye, no previous injury to eye, no recent herpes zoster and no recent URI     Past Medical History  Diagnosis Date  . Asthma   . Hyperlipidemia    Past Surgical History  Procedure Laterality Date  . Back surgery     History reviewed. No pertinent family history. Social History  Substance Use Topics  . Smoking  status: Never Smoker   . Smokeless tobacco: None  . Alcohol Use: Yes    Review of Systems  Eyes: Positive for blurred vision, double vision and discharge. Negative for photophobia, redness and itching.  Gastrointestinal: Negative for nausea.  Neurological: Negative for tingling, numbness and headaches.  All other systems reviewed and are negative.   Allergies  Erythromycin  Home Medications   Prior to Admission medications   Medication Sig Start Date End Date Taking? Authorizing Provider  albuterol (PROVENTIL HFA;VENTOLIN HFA) 108 (90 BASE) MCG/ACT inhaler Inhale 2 puffs into the lungs every 4 (four) hours as needed for wheezing. 09/28/11 09/27/12  Lattie HawStephen A Diana Armijo, MD  gabapentin (NEURONTIN) 300 MG capsule One tab PO qHS for a week, then BID for a week, then TID. May double weekly to a max of 3,600mg /day 07/08/14   Monica Bectonhomas J Thekkekandam, MD   Meds Ordered and Administered this Visit  Medications - No data to display  BP 161/89 mmHg  Pulse 70  Temp(Src) 98.3 F (36.8 C) (Oral)  Resp 16  Ht 6' (1.829 m)  Wt 238 lb (107.956 kg)  BMI 32.27 kg/m2  SpO2 98% No data found.   Physical Exam  Constitutional: He is oriented to person, place, and time. He appears well-developed and well-nourished. No distress.  HENT:  Head: Normocephalic.  Right Ear: Tympanic membrane, external ear and ear canal normal.  Left Ear: Tympanic membrane, external ear and ear canal normal.  Nose: Nose normal.  Mouth/Throat: Oropharynx is clear and moist.  Eyes: Conjunctivae,  EOM and lids are normal. Pupils are equal, round, and reactive to light. Lids are everted and swept, no foreign bodies found. Right eye exhibits no chemosis, no discharge, no exudate and no hordeolum. No foreign body present in the right eye. Left eye exhibits no chemosis, no discharge, no exudate and no hordeolum. No foreign body present in the left eye.  No eyelid tenderness to palpation.  Fundi benign.  No photophobia. Fluorescein to  the right eye reveals no uptake.  Neck: Neck supple. Carotid bruit is not present. No thyroid mass present.  Cardiovascular: Normal heart sounds.   Pulmonary/Chest: Breath sounds normal.  Musculoskeletal: He exhibits no edema.  Lymphadenopathy:    He has no cervical adenopathy.  Neurological: He is alert and oriented to person, place, and time. He has normal reflexes. No cranial nerve deficit or sensory deficit. He exhibits normal muscle tone. Coordination and gait normal.  Skin: Skin is warm and dry. No rash noted.  Nursing note and vitals reviewed.   ED Course  Procedures none   Visual Acuity Review  Right Eye Distance: 20/15 Left Eye Distance: 20/50 Bilateral Distance: 20/15    MDM   1. Blurred vision, right eye; resolution of symptoms and normal exam reassuring   ?posterior vitreous detachment. Recommend follow-up with ophthalmologist tomorrow. If symptoms become significantly worse during the night or over the weekend, proceed to the local emergency room.     Lattie Haw, MD 10/19/15 1150

## 2015-10-18 NOTE — ED Notes (Signed)
Pt c/o RT eye blurry vision x 2 days, with episode of worsening blurry vision x today at 1700 in both eyes.

## 2015-10-19 NOTE — Discharge Instructions (Signed)
Visual Disturbances °You have had a disturbance in your vision. This may be caused by various conditions, such as: °· Migraines. Migraine headaches are often preceded by a disturbance in vision. Blind spots or light flashes are followed by a headache. This type of visual disturbance is temporary. It does not damage the eye. °· Glaucoma. This is caused by increased pressure in the eye. Symptoms include haziness, blurred vision, or seeing rainbow colored circles when looking at bright lights. Partial or complete visual loss can occur. You may or may not experience eye pain. Visual loss may be gradual or sudden and is irreversible. Glaucoma is the leading cause of blindness. °· Retina problems. Vision will be reduced if the retina becomes detached or if there is a circulation problem as with diabetes, high blood pressure, or a mini-stroke. Symptoms include seeing "floaters," flashes of light, or shadows, as if a curtain has fallen over your eye. °· Optic nerve problems. The main nerve in your eye can be damaged by redness, soreness, and swelling (inflammation), poor circulation, drugs, and toxins. °It is very important to have a complete exam done by a specialist to determine the exact cause of your eye problem. The specialist may recommend medicines or surgery, depending on the cause of the problem. This can help prevent further loss of vision or reduce the risk of having a stroke. Contact the caregiver to whom you have been referred and arrange for follow-up care right away. °SEEK IMMEDIATE MEDICAL CARE IF:  °· Your vision gets worse. °· You develop severe headaches. °· You have any weakness or numbness in the face, arms, or legs. °· You have any trouble speaking or walking. °  °This information is not intended to replace advice given to you by your health care provider. Make sure you discuss any questions you have with your health care provider. °  °Document Released: 05/02/2004 Document Revised: 06/17/2011 Document  Reviewed: 09/01/2013 °Elsevier Interactive Patient Education ©2016 [employer] ° °

## 2015-10-21 ENCOUNTER — Telehealth: Payer: Self-pay | Admitting: Emergency Medicine

## 2015-10-21 NOTE — Telephone Encounter (Signed)
Left a message, no answer.

## 2015-11-14 ENCOUNTER — Ambulatory Visit (INDEPENDENT_AMBULATORY_CARE_PROVIDER_SITE_OTHER): Payer: BLUE CROSS/BLUE SHIELD | Admitting: Family Medicine

## 2015-11-14 ENCOUNTER — Encounter: Payer: Self-pay | Admitting: Family Medicine

## 2015-11-14 DIAGNOSIS — M7711 Lateral epicondylitis, right elbow: Secondary | ICD-10-CM | POA: Diagnosis not present

## 2015-11-14 NOTE — Patient Instructions (Signed)
Thank you for coming in today. Do the exercises we discussed.  Return in 1 month or sooner if needed.    Lateral Epicondylitis With Rehab Lateral epicondylitis involves inflammation and pain around the outer portion of the elbow. The pain is caused by inflammation of the tendons in the forearm that bring back (extend) the wrist. Lateral epicondylitis is also called tennis elbow, because it is very common in tennis players. However, it may occur in any individual who extends the wrist repetitively. If lateral epicondylitis is left untreated, it may become a chronic problem. SYMPTOMS   Pain, tenderness, and inflammation on the outer (lateral) side of the elbow.  Pain or weakness with gripping activities.  Pain that increases with wrist-twisting motions (playing tennis, using a screwdriver, opening a door or a jar).  Pain with lifting objects, including a coffee cup. CAUSES  Lateral epicondylitis is caused by inflammation of the tendons that extend the wrist. Causes of injury may include:  Repetitive stress and strain on the muscles and tendons that extend the wrist.  Sudden change in activity level or intensity.  Incorrect grip in racquet sports.  Incorrect grip size of racquet (often too large).  Incorrect hitting position or technique (usually backhand, leading with the elbow).  Using a racket that is too heavy. RISK INCREASES WITH:  Sports or occupations that require repetitive and/or strenuous forearm and wrist movements (tennis, squash, racquetball, carpentry).  Poor wrist and forearm strength and flexibility.  Failure to warm up properly before activity.  Resuming activity before healing, rehabilitation, and conditioning are complete. PREVENTION   Warm up and stretch properly before activity.  Maintain physical fitness:  Strength, flexibility, and endurance.  Cardiovascular fitness.  Wear and use properly fitted equipment.  Learn and use proper technique and  have a coach correct improper technique.  Wear a tennis elbow (counterforce) brace. PROGNOSIS  The course of this condition depends on the degree of the injury. If treated properly, acute cases (symptoms lasting less than 4 weeks) are often resolved in 2 to 6 weeks. Chronic (longer lasting cases) often resolve in 3 to 6 months but may require physical therapy. RELATED COMPLICATIONS   Frequently recurring symptoms, resulting in a chronic problem. Properly treating the problem the first time decreases frequency of recurrence.  Chronic inflammation, scarring tendon degeneration, and partial tendon tear, requiring surgery.  Delayed healing or resolution of symptoms. TREATMENT  Treatment first involves the use of ice and medicine to reduce pain and inflammation. Strengthening and stretching exercises may help reduce discomfort if performed regularly. These exercises may be performed at home if the condition is an acute injury. Chronic cases may require a referral to a physical therapist for evaluation and treatment. Your caregiver may advise a corticosteroid injection to help reduce inflammation. Rarely, surgery is needed. MEDICATION  If pain medicine is needed, nonsteroidal anti-inflammatory medicines (aspirin and ibuprofen), or other minor pain relievers (acetaminophen), are often advised.  Do not take pain medicine for 7 days before surgery.  Prescription pain relievers may be given, if your caregiver thinks they are needed. Use only as directed and only as much as you need.  Corticosteroid injections may be recommended. These injections should be reserved only for the most severe cases, because they can only be given a certain number of times. HEAT AND COLD  Cold treatment (icing) should be applied for 10 to 15 minutes every 2 to 3 hours for inflammation and pain, and immediately after activity that aggravates your symptoms. Use ice  packs or an ice massage.  Heat treatment may be used  before performing stretching and strengthening activities prescribed by your caregiver, physical therapist, or athletic trainer. Use a heat pack or a warm water soak. SEEK MEDICAL CARE IF: Symptoms get worse or do not improve in 2 weeks, despite treatment. EXERCISES  RANGE OF MOTION (ROM) AND STRETCHING EXERCISES - Epicondylitis, Lateral (Tennis Elbow) These exercises may help you when beginning to rehabilitate your injury. Your symptoms may go away with or without further involvement from your physician, physical therapist, or athletic trainer. While completing these exercises, remember:   Restoring tissue flexibility helps normal motion to return to the joints. This allows healthier, less painful movement and activity.  An effective stretch should be held for at least 30 seconds.  A stretch should never be painful. You should only feel a gentle lengthening or release in the stretched tissue. RANGE OF MOTION - Wrist Flexion, Active-Assisted  Extend your right / left elbow with your fingers pointing down.*  Gently pull the back of your hand towards you, until you feel a gentle stretch on the top of your forearm.  Hold this position for __________ seconds. Repeat __________ times. Complete this exercise __________ times per day.  *If directed by your physician, physical therapist or athletic trainer, complete this stretch with your elbow bent, rather than extended. RANGE OF MOTION - Wrist Extension, Active-Assisted  Extend your right / left elbow and turn your palm upwards.*  Gently pull your palm and fingertips back, so your wrist extends and your fingers point more toward the ground.  You should feel a gentle stretch on the inside of your forearm.  Hold this position for __________ seconds. Repeat __________ times. Complete this exercise __________ times per day. *If directed by your physician, physical therapist or athletic trainer, complete this stretch with your elbow bent, rather  than extended. STRETCH - Wrist Flexion  Place the back of your right / left hand on a tabletop, leaving your elbow slightly bent. Your fingers should point away from your body.  Gently press the back of your hand down onto the table by straightening your elbow. You should feel a stretch on the top of your forearm.  Hold this position for __________ seconds. Repeat __________ times. Complete this stretch __________ times per day.  STRETCH - Wrist Extension   Place your right / left fingertips on a tabletop, leaving your elbow slightly bent. Your fingers should point backwards.  Gently press your fingers and palm down onto the table by straightening your elbow. You should feel a stretch on the inside of your forearm.  Hold this position for __________ seconds. Repeat __________ times. Complete this stretch __________ times per day.  STRENGTHENING EXERCISES - Epicondylitis, Lateral (Tennis Elbow) These exercises may help you when beginning to rehabilitate your injury. They may resolve your symptoms with or without further involvement from your physician, physical therapist, or athletic trainer. While completing these exercises, remember:   Muscles can gain both the endurance and the strength needed for everyday activities through controlled exercises.  Complete these exercises as instructed by your physician, physical therapist or athletic trainer. Increase the resistance and repetitions only as guided.  You may experience muscle soreness or fatigue, but the pain or discomfort you are trying to eliminate should never worsen during these exercises. If this pain does get worse, stop and make sure you are following the directions exactly. If the pain is still present after adjustments, discontinue the exercise until you  can discuss the trouble with your caregiver. STRENGTH - Wrist Flexors  Sit with your right / left forearm palm-up and fully supported on a table or countertop. Your elbow should  be resting below the height of your shoulder. Allow your wrist to extend over the edge of the surface.  Loosely holding a __________ weight, or a piece of rubber exercise band or tubing, slowly curl your hand up toward your forearm.  Hold this position for __________ seconds. Slowly lower the wrist back to the starting position in a controlled manner. Repeat __________ times. Complete this exercise __________ times per day.  STRENGTH - Wrist Extensors  Sit with your right / left forearm palm-down and fully supported on a table or countertop. Your elbow should be resting below the height of your shoulder. Allow your wrist to extend over the edge of the surface.  Loosely holding a __________ weight, or a piece of rubber exercise band or tubing, slowly curl your hand up toward your forearm.  Hold this position for __________ seconds. Slowly lower the wrist back to the starting position in a controlled manner. Repeat __________ times. Complete this exercise __________ times per day.  STRENGTH - Ulnar Deviators  Stand with a ____________________ weight in your right / left hand, or sit while holding a rubber exercise band or tubing, with your healthy arm supported on a table or countertop.  Move your wrist, so that your pinkie travels toward your forearm and your thumb moves away from your forearm.  Hold this position for __________ seconds and then slowly lower the wrist back to the starting position. Repeat __________ times. Complete this exercise __________ times per day STRENGTH - Radial Deviators  Stand with a ____________________ weight in your right / left hand, or sit while holding a rubber exercise band or tubing, with your injured arm supported on a table or countertop.  Raise your hand upward in front of you or pull up on the rubber tubing.  Hold this position for __________ seconds and then slowly lower the wrist back to the starting position. Repeat __________ times. Complete  this exercise __________ times per day. STRENGTH - Forearm Supinators   Sit with your right / left forearm supported on a table, keeping your elbow below shoulder height. Rest your hand over the edge, palm down.  Gently grip a hammer or a soup ladle.  Without moving your elbow, slowly turn your palm and hand upward to a "thumbs-up" position.  Hold this position for __________ seconds. Slowly return to the starting position. Repeat __________ times. Complete this exercise __________ times per day.  STRENGTH - Forearm Pronators   Sit with your right / left forearm supported on a table, keeping your elbow below shoulder height. Rest your hand over the edge, palm up.  Gently grip a hammer or a soup ladle.  Without moving your elbow, slowly turn your palm and hand upward to a "thumbs-up" position.  Hold this position for __________ seconds. Slowly return to the starting position. Repeat __________ times. Complete this exercise __________ times per day.  STRENGTH - Grip  Grasp a tennis ball, a dense sponge, or a large, rolled sock in your hand.  Squeeze as hard as you can, without increasing any pain.  Hold this position for __________ seconds. Release your grip slowly. Repeat __________ times. Complete this exercise __________ times per day.  STRENGTH - Elbow Extensors, Isometric  Stand or sit upright, on a firm surface. Place your right / left arm so  that your palm faces your stomach, and it is at the height of your waist.  Place your opposite hand on the underside of your forearm. Gently push up as your right / left arm resists. Push as hard as you can with both arms, without causing any pain or movement at your right / left elbow. Hold this stationary position for __________ seconds. Gradually release the tension in both arms. Allow your muscles to relax completely before repeating.   This information is not intended to replace advice given to you by your health care provider. Make  sure you discuss any questions you have with your health care provider.   Document Released: 03/25/2005 Document Revised: 04/15/2014 Document Reviewed: 07/07/2008 Elsevier Interactive Patient Education [employer]2016 Else[employer] Inc.

## 2015-11-14 NOTE — Progress Notes (Signed)
   Martin Kennedy is a 50 y.o. male who [facility] today for right elbow pain. The patient has 2 warts of right lateral elbow pain worse with activity and wrist motion and better with rest. He denies any radiating pain weakness or numbness. He does have some pain on the medial aspect of his elbow as well. He notes grip and hand shake especially worsens his pain. Is not really tried much treatment yet. He notes the pain occurred after playing tennis but denies any specific injury.   Past Medical History:  Diagnosis Date  . Asthma   . Hyperlipidemia    Past Surgical History:  Procedure Laterality Date  . BACK SURGERY     Social History  Substance Use Topics  . Smoking status: Never Smoker  . Smokeless tobacco: Not on file  . Alcohol use Yes   family history is not on file.  ROS:  No headache, visual changes, nausea, vomiting, diarrhea, constipation, dizziness, abdominal pain, skin rash, fevers, chills, night sweats, weight loss, swollen lymph nodes, body aches, joint swelling, muscle aches, chest pain, shortness of breath, mood changes, visual or auditory hallucinations.    Medications: Current Outpatient Prescriptions  Medication Sig Dispense Refill  . gabapentin (NEURONTIN) 300 MG capsule One tab PO qHS for a week, then BID for a week, then TID. May double weekly to a max of 3,600mg /day 1 capsule 3   No current facility-administered medications for this visit.    Allergies  Allergen Reactions  . Erythromycin      Exam:  BP (!) 154/95   Pulse 71   Wt 239 lb (108.4 kg)   BMI 32.41 kg/m  General: Well Developed, well nourished, and in no acute distress.  Neuro/Psych: Alert and oriented x3, extra-ocular muscles intact, able to move all 4 extremities, sensation grossly intact. Skin: Warm and dry, no rashes noted.  Respiratory: Not using accessory muscles, speaking in full sentences, trachea midline.  Cardiovascular:  Pulses palpable, no extremity edema. Abdomen: Does not appear distended. MSK: Right elbow normal-appearing. Mildly tender to palpation lateral epicondyle. Nontender medial epicondyle and anterior elbow. Normal elevation. Significant pain with resisted wrist extension and hand shake grip. No significant pain with pronation supination or wrist flexion. Pulses capillary refill and sensation intact distally.   No results found for this or any previous visit (from the past 24 hour(s)). No results found.   Assessment and Plan: 50 y.o. male with lateral epicondylitis. Discussed options. Patient declined injections and x-rays today. Plan for home physical therapy exercises with eccentric exercises. Recheck in a month if not better.   Discussed warning signs or symptoms. Please see discharge instructions. Patient expresses understanding.

## 2015-11-15 DIAGNOSIS — D485 Neoplasm of uncertain behavior of skin: Secondary | ICD-10-CM | POA: Diagnosis not present

## 2015-11-15 DIAGNOSIS — L738 Other specified follicular disorders: Secondary | ICD-10-CM | POA: Diagnosis not present

## 2015-11-15 DIAGNOSIS — D225 Melanocytic nevi of trunk: Secondary | ICD-10-CM | POA: Diagnosis not present

## 2015-11-15 DIAGNOSIS — L729 Follicular cyst of the skin and subcutaneous tissue, unspecified: Secondary | ICD-10-CM | POA: Diagnosis not present

## 2015-11-15 DIAGNOSIS — L720 Epidermal cyst: Secondary | ICD-10-CM | POA: Diagnosis not present

## 2016-01-15 ENCOUNTER — Ambulatory Visit: Payer: BLUE CROSS/BLUE SHIELD | Admitting: Sports Medicine

## 2016-03-19 DIAGNOSIS — M25521 Pain in right elbow: Secondary | ICD-10-CM | POA: Diagnosis not present

## 2016-04-10 ENCOUNTER — Encounter: Payer: Self-pay | Admitting: Rehabilitative and Restorative Service Providers"

## 2016-04-10 ENCOUNTER — Ambulatory Visit (INDEPENDENT_AMBULATORY_CARE_PROVIDER_SITE_OTHER): Payer: BLUE CROSS/BLUE SHIELD | Admitting: Rehabilitative and Restorative Service Providers"

## 2016-04-10 DIAGNOSIS — M7711 Lateral epicondylitis, right elbow: Secondary | ICD-10-CM | POA: Diagnosis not present

## 2016-04-10 DIAGNOSIS — R29898 Other symptoms and signs involving the musculoskeletal system: Secondary | ICD-10-CM

## 2016-04-10 NOTE — Therapy (Signed)
[facility] [place] [address] [place], [place], [address] Phone: [phone]   Fax:  [phone]  Physical Therapy Evaluation  Patient Details  Name: Martin Kennedy MRN: [id number] Date of Birth: 1965-10-06 Referring Provider: Dr Clementeen Graham   Encounter Date: 04/10/2016      PT End of Session - 04/10/16 0908    Visit Number 1   Number of Visits 12   Date for PT Re-Evaluation 05/22/16   PT Start Time 0815   PT Stop Time 0917   PT Time Calculation (min) 62 min   Activity Tolerance Patient tolerated treatment well      Past Medical History:  Diagnosis Date  . Asthma   . Hyperlipidemia     Past Surgical History:  Procedure Laterality Date  . BACK SURGERY      There were no vitals filed for this visit.       Subjective Assessment - 04/10/16 0815    Subjective Played tennis 5/17 and developed tennis elbow after not having played tennis in a while. Symptoms have persisted. He is unable to squeeze with Rt hand or grip objects without pain.    Pertinent History neck and back pain    Patient Stated Goals get rid of the elbow pain and use arm normally    Currently in Pain? Yes   Pain Score 0-No pain  with use 8/10    Pain Location Elbow   Pain Orientation Right   Pain Descriptors / Indicators Sharp   Pain Type Chronic pain   Pain Onset More than a month ago   Pain Frequency Intermittent   Aggravating Factors  squeezing; turning hand por arm at an odd angle    Pain Relieving Factors avoid activities that create pain             [facility] PT Assessment - 04/10/16 0001      Assessment   Medical Diagnosis Rt lateral epicondylitis    Referring Provider Dr Clementeen Graham    Onset Date/Surgical Date 08/21/15   Hand Dominance Right   Next MD Visit none scheduled   Prior Therapy for neck and back      Precautions   Precautions None     Balance Screen   Has the patient fallen in the past 6 months No   Has the patient  had a decrease in activity level because of a fear of falling?  No   Is the patient reluctant to leave their home because of a fear of falling?  No     Prior Function   Level of Independence Independent   Vocation Full time employment   Vocation Requirements desk/financial    Leisure computer work; Gaffer; some cooking      Observation/Other Assessments   Focus on Therapeutic Outcomes (FOTO)  48% limitation      Sensation   Additional Comments WFL's per pt report      Posture/Postural Control   Posture Comments head forward; shoulders rounded and elevated; increased thoracic kyphosis; hold Rt UE in abducted and IR position at side      AROM   Overall AROM Comments WFL's bilat UE's      Strength   Right/Left Wrist --  pain with resisted Rt wrist ext; forearm supination    Right Hand Grip (lbs) --  132/140/120   Right Hand Lateral Pinch 18 lbs   Right Hand 3 Point Pinch 9 lbs   Left Hand Grip (lbs) --  132/125/120  Left Hand Lateral Pinch 15 lbs   Left Hand 3 Point Pinch 11 lbs     Palpation   Palpation comment significant tightness through Rt extensor forearm into lateral epicondyle                    [facility] Adult PT Treatment/Exercise - 04/10/16 0001      Therapeutic Activites    Therapeutic Activities --  education re keyboard ergonomics; posture      Wrist Exercises   Other wrist exercises wrist extensor stretch to ulnar side 30 sec x 3 before TDN x 3 after TDN      Moist Heat Therapy   Number Minutes Moist Heat 15 Minutes   Moist Heat Location Elbow     Manual Therapy   Soft tissue mobilization Rt extensor forearm           Trigger Point Dry Needling - 04/10/16 0906    Consent Given? Yes   Education Handout Provided Yes   Muscles Treated Upper Body --  Rt extensor forearm x3/decreased tightness to palpation               PT Education - 04/10/16 0847    Education provided Yes   Education Details HEP, transverse friction  massage; TDN    Person(s) Educated Patient   Methods Explanation;Demonstration;Tactile cues;Verbal cues;Handout   Comprehension Verbalized understanding;Returned demonstration;Verbal cues required;Tactile cues required             PT Long Term Goals - 04/10/16 0921      PT LONG TERM GOAL #1   Title Pain free resistive wrist extension and grip testing Rt UE 05/22/16   Time 6   Period Weeks   Status New     PT LONG TERM GOAL #2   Title Patient to report ability to shake hands with minimal pain Rt UE 05/22/16   Time 6   Period Weeks   Status New     PT LONG TERM GOAL #3   Title Decreased tenderness and tightness extensor forearm/epicondyle 05/22/16   Time 6   Period Weeks   Status New     PT LONG TERM GOAL #4   Title Independent in HEP 05/22/16   Time 6   Period Weeks   Status New     PT LONG TERM GOAL #5   Title Improve FOTO to </= 33% limitation 05/22/16   Time 6   Period Weeks   Status New               Plan - 04/10/16 0916    Clinical Impression Statement Patient presents with 7 month history of Rt lateral epicondylitis. He has pain with functional grip/grasp/squeeze activities; pain with resisted wrist extension and grip/pinch testing; pain and muscular binding/tightness through the Rt extensor forearm.    Rehab Potential Good   PT Frequency 2x / week   PT Duration 6 weeks   PT Treatment/Interventions Patient/family education;ADLs/Self Care Home Management;Cryotherapy;Electrical Stimulation;Iontophoresis 4mg /ml Dexamethasone;Moist Heat;Ultrasound;Dry needling;Manual techniques;Therapeutic activities;Therapeutic exercise   PT Next Visit Plan deep tissue/ myofacail release work through the Rt extensor forearm; TND as indicated; stretching; strengthening posterior shoulder girdle musculature; ergonomis education    Consulted and Agree with Plan of Care Patient      Patient will benefit from skilled therapeutic intervention in order to improve the following  deficits and impairments:  Postural dysfunction, Improper body mechanics, Pain, Impaired UE functional use, Increased fascial restricitons, Increased muscle spasms, Decreased activity tolerance  Visit Diagnosis: Lateral epicondylitis of right elbow - Plan: PT plan of care cert/re-cert  Other symptoms and signs involving the musculoskeletal system - Plan: PT plan of care cert/re-cert     Problem List Patient Active Problem List   Diagnosis Date Noted  . Lateral epicondylitis of right elbow 11/14/2015  . Obesity 02/03/2014  . Lumbar degenerative disc disease 09/03/2013  . Osteoarthritis, hand 12/24/2012  . Cervical radiculitis 07/30/2012  . KERATOSIS PILARIS 01/29/2011  . ASTHMA 08/18/2010  . DERMATITIS, ATOPIC 08/18/2010  . HYPERLIPIDEMIA 07/21/2010    Celyn Rober Minion PT, MPH  04/10/2016, 9:25 AM  [facility] [address] [place] [address] [place], [place], [address] Phone: [phone]   Fax:  [phone]  Name: Martin Kennedy MRN: [id number] Date of Birth: [birth year]

## 2016-04-10 NOTE — Patient Instructions (Signed)
Extension: Stretch - Forearm Extensors (Sitting)    Position Helper: Support left arm at elbow. Motion -Helper bends wrist down gently and folds fingers into fist. -Then straighten elbow fully. CAUTION: Do not force movement if painful. Hold __30_ seconds. Repeat _3__ times. Repeat with other arm. Do _4-5__ sessions per day. Variation: Bend wrist down and in direction of little finger during motion.  Trigger Point Dry Needling  . What is Trigger Point Dry Needling (DN)? o DN is a physical therapy technique used to treat muscle pain and dysfunction. Specifically, DN helps deactivate muscle trigger points (muscle knots).  o A thin filiform needle is used to penetrate the skin and stimulate the underlying trigger point. The goal is for a local twitch response (LTR) to occur and for the trigger point to relax. No medication of any kind is injected during the procedure.   . What Does Trigger Point Dry Needling Feel Like?  o The procedure feels different for each individual patient. Some patients report that they do not actually feel the needle enter the skin and overall the process is not painful. Very mild bleeding may occur. However, many patients feel a deep cramping in the muscle in which the needle was inserted. This is the local twitch response.   Marland Kitchen. How Will I feel after the treatment? o Soreness is normal, and the onset of soreness may not occur for a few hours. Typically this soreness does not last longer than two days.  o Bruising is uncommon, however; ice can be used to decrease any possible bruising.  o In rare cases feeling tired or nauseous after the treatment is normal. In addition, your symptoms may get worse before they get better, this period will typically not last longer than 24 hours.   . What Can I do After My Treatment? o Increase your hydration by drinking more water for the next 24 hours. o You may place ice or heat on the areas treated that have become sore, however, do not  use heat on inflamed or bruised areas. Heat often brings more relief post needling. o You can continue your regular activities, but vigorous activity is not recommended initially after the treatment for 24 hours. o DN is best combined with other physical therapy such as strengthening, stretching, and other therapies.     Copyright  VHI. All rights reserved.

## 2016-04-12 ENCOUNTER — Ambulatory Visit (INDEPENDENT_AMBULATORY_CARE_PROVIDER_SITE_OTHER): Payer: BLUE CROSS/BLUE SHIELD | Admitting: Rehabilitative and Restorative Service Providers"

## 2016-04-12 ENCOUNTER — Encounter: Payer: Self-pay | Admitting: Rehabilitative and Restorative Service Providers"

## 2016-04-12 DIAGNOSIS — R29898 Other symptoms and signs involving the musculoskeletal system: Secondary | ICD-10-CM | POA: Diagnosis not present

## 2016-04-12 DIAGNOSIS — M7711 Lateral epicondylitis, right elbow: Secondary | ICD-10-CM | POA: Diagnosis not present

## 2016-04-12 NOTE — Therapy (Signed)
[facility] [place] [address] [place] [place], [place], [address] Phone: [phone]   Fax:  [phone]  Physical Therapy Treatment  Patient Details  Name: Martin Kennedy MRN: [id number] Date of Birth: 11-11-1965 Referring Provider: Dr Clementeen Graham   Encounter Date: 04/12/2016      PT End of Session - 04/12/16 0825    Visit Number 2   Number of Visits 12   Date for PT Re-Evaluation 05/22/16   PT Start Time 0808   PT Stop Time 0852   PT Time Calculation (min) 44 min   Activity Tolerance Patient tolerated treatment well      Past Medical History:  Diagnosis Date  . Asthma   . Hyperlipidemia     Past Surgical History:  Procedure Laterality Date  . BACK SURGERY      There were no vitals filed for this visit.      Subjective Assessment - 04/12/16 0825    Subjective Continued soreness from the TDN - has researched dry needling and does not want to try that again. No pain this am - has not done anything to irritate the symptoms   Currently in Pain? No/denies                         [facility] Adult PT Treatment/Exercise - 04/12/16 0001      Shoulder Exercises: Prone   Other Prone Exercises W's; airplane; superman 10 reps 5 sec hold      Shoulder Exercises: Standing   Other Standing Exercises 3 way doorway stretch 30 sec x 3      Wrist Exercises   Other wrist exercises wrist extensor stretch to ulnar side 30 sec x 3    Other wrist exercises supine stretch for Rt UE - shd 90 deg abd/elbow ext; wrist flexed light fist 1 min x 2 reps      Ultrasound   Ultrasound Location Rt extensor forearm/lateral epicondyle    Ultrasound Parameters 1 mHz; 1.0 w/cm2; 100% x 6 min extensor forearm/50% lateral epicondyle x 4 min    Ultrasound Goals Other (Comment);Pain  muscular tightness      Iontophoresis   Type of Iontophoresis Dexamethasone   Location Rt lateral epicondyle   Dose 120 m/amp   Time 8 hr     Manual  Therapy   Soft tissue mobilization Rt extensor forearm   transverse friction massage 1-2 min distal to lat epicondyle                PT Education - 04/12/16 0831    Education provided Yes   Person(s) Educated Patient   Methods Explanation;Demonstration;Verbal cues;Tactile cues;Handout   Comprehension Verbalized understanding;Returned demonstration;Verbal cues required;Tactile cues required             PT Long Term Goals - 04/10/16 0921      PT LONG TERM GOAL #1   Title Pain free resistive wrist extension and grip testing Rt UE 05/22/16   Time 6   Period Weeks   Status New     PT LONG TERM GOAL #2   Title Patient to report ability to shake hands with minimal pain Rt UE 05/22/16   Time 6   Period Weeks   Status New     PT LONG TERM GOAL #3   Title Decreased tenderness and tightness extensor forearm/epicondyle 05/22/16   Time 6   Period Weeks   Status New     PT LONG TERM  GOAL #4   Title Independent in HEP 05/22/16   Time 6   Period Weeks   Status New     PT LONG TERM GOAL #5   Title Improve FOTO to </= 33% limitation 05/22/16   Time 6   Period Weeks   Status New               Plan - 04/12/16 [id number]    Clinical Impression Statement Sore from TDN and does not wish to continue with TDN treatments. Can feel the supine stretch for Rt UE more in the area of the elbow than sitting extensor forearm stretch. Continued tightness in extensor forearm.    Rehab Potential Good   PT Frequency 2x / week   PT Duration 6 weeks   PT Treatment/Interventions Patient/family education;ADLs/Self Care Home Management;Cryotherapy;Electrical Stimulation;Iontophoresis 4mg /ml Dexamethasone;Moist Heat;Ultrasound;Dry needling;Manual techniques;Therapeutic activities;Therapeutic exercise   PT Next Visit Plan deep tissue/ myofacial release work through the Rt extensor forearm; no TND at patient's request; stretching; strengthening posterior shoulder girdle musculature; ergonomis  education    Consulted and Agree with Plan of Care Patient      Patient will benefit from skilled therapeutic intervention in order to improve the following deficits and impairments:  Postural dysfunction, Improper body mechanics, Pain, Impaired UE functional use, Increased fascial restricitons, Increased muscle spasms, Decreased activity tolerance  Visit Diagnosis: Lateral epicondylitis of right elbow  Other symptoms and signs involving the musculoskeletal system     Problem List Patient Active Problem List   Diagnosis Date Noted  . Lateral epicondylitis of right elbow 11/14/2015  . Obesity 02/03/2014  . Lumbar degenerative disc disease 09/03/2013  . Osteoarthritis, hand 12/24/2012  . Cervical radiculitis 07/30/2012  . KERATOSIS PILARIS 01/29/2011  . ASTHMA 08/18/2010  . DERMATITIS, ATOPIC 08/18/2010  . HYPERLIPIDEMIA 07/21/2010    Jakaria Lavergne Rober MinionP Chaz Mcglasson PT, MPH  04/12/2016, 9:01 AM  [facility] [address] [place], [place], [address] Phone: [phone]   Fax:  [phone]  Name: Valere DrossDavid Offer MRN: [id number] Date of Birth: 12/01/1965

## 2016-04-12 NOTE — Patient Instructions (Addendum)
Scapula Adduction With Pectoralis Stretch: Low - Standing   Shoulders at 45 hands even with shoulders, keeping weight through legs, shift weight forward until you feel pull or stretch through the front of your chest. Hold _30__ seconds. Do _3__ times, _2-4__ times per day.   Scapula Adduction With Pectoralis Stretch: Mid-Range - Standing   Shoulders at 90 elbows even with shoulders, keeping weight through legs, shift weight forward until you feel pull or strength through the front of your chest. Hold __30_ seconds. Do _3__ times, __2-4_ times per day.   Scapula Adduction With Pectoralis Stretch: High - Standing   Shoulders at 120 hands up high on the doorway, keeping weight on feet, shift weight forward until you feel pull or stretch through the front of your chest. Hold _30__ seconds. Do _3__ times, _2-3__ times per day.   Shoulder Blade Squeeze: W    Arms out to sides at 90 palms down. Bend elbows to 90. Press pelvis down. Squeeze backbone with shoulder blades. Raise arms, front of shoulders, chest, and head. Keep neck neutral. Hold _5__ seconds. Relax. Repeat _10-20__ times.   Shoulder Blade Squeeze: Airplane    Arms out to sides at 90, elbows straight, palms down. Press pelvis down. Squeeze backbone with shoulder blades. Raise arms, front of shoulders, chest, and head. Keep neck neutral. Hold _5__ seconds. Relax. Repeat _10-20__ times.   Shoulder Blade Squeeze: Superperson    Arms alongside head, elbows straight, palms down. Press pelvis down. Squeeze backbone with shoulder blades. Raise arms, chest, and head. Keep neck neutral. Hold _5__ seconds. Relax. Repeat _10-20__ times.

## 2016-04-15 ENCOUNTER — Ambulatory Visit (INDEPENDENT_AMBULATORY_CARE_PROVIDER_SITE_OTHER): Payer: BLUE CROSS/BLUE SHIELD | Admitting: Rehabilitative and Restorative Service Providers"

## 2016-04-15 ENCOUNTER — Encounter: Payer: Self-pay | Admitting: Rehabilitative and Restorative Service Providers"

## 2016-04-15 DIAGNOSIS — R29898 Other symptoms and signs involving the musculoskeletal system: Secondary | ICD-10-CM | POA: Diagnosis not present

## 2016-04-15 DIAGNOSIS — M7711 Lateral epicondylitis, right elbow: Secondary | ICD-10-CM

## 2016-04-15 NOTE — Therapy (Signed)
[facility] [place] [address] [place], [place], [address] Phone: [phone]   Fax:  [phone]  Physical Therapy Treatment  Patient Details  Name: Martin Kennedy MRN: [id number] Date of Birth: [birth year] Referring Provider: Dr Clementeen Graham   Encounter Date: 04/15/2016      PT End of Session - 04/15/16 0806    Visit Number 3   Number of Visits 12   Date for PT Re-Evaluation 05/22/16   PT Start Time 0800   PT Stop Time 0845   PT Time Calculation (min) 45 min   Activity Tolerance Patient tolerated treatment well      Past Medical History:  Diagnosis Date  . Asthma   . Hyperlipidemia     Past Surgical History:  Procedure Laterality Date  . BACK SURGERY      There were no vitals filed for this visit.      Subjective Assessment - 04/15/16 0804    Subjective No change in symptoms. Patient reports that he may have felt a little better the day the ionto was on but experienced pain when shaking hands with someone yesterday at the same intensity he has experienced in the past.    Currently in Pain? No/denies                         [facility] Adult PT Treatment/Exercise - 04/15/16 0001      Shoulder Exercises: Prone   Other Prone Exercises W's; airplane; superman 10 reps 5 sec hold      Shoulder Exercises: Standing   Other Standing Exercises 3 way doorway stretch 30 sec x 3      Shoulder Exercises: ROM/Strengthening   UBE (Upper Arm Bike) L2 x 4 min alt fwd/back      Wrist Exercises   Other wrist exercises wrist extensor stretch to ulnar side 30 sec x 3    Other wrist exercises supine stretch for Rt UE - shd 90 deg abd/elbow ext; wrist flexed light fist 1 min x 2 reps   can feel stretch into extensor forearm      Ultrasound   Ultrasound Location Rt extensor forearm/lateral epicondyle    Ultrasound Parameters 1.0 w/cm2 100% x 5 min extensor forearm; .5 w/cm2x 5 min lateral epicondyle    Ultrasound  Goals Other (Comment);Pain  muscular tightness      Iontophoresis   Type of Iontophoresis Dexamethasone   Location Rt lateral epicondyle   Dose 120 m/amp   Time 8 hr     Manual Therapy   Soft tissue mobilization Rt extensor forearm - npt in supine working also through the distal arm   transverse friction massage 1-2 min distal to lat epicondyle   Myofascial Release Rt extensor forearm   Passive ROM Rt hand/wrist - extensor forearm stretch with PT assist pt supine                      PT Long Term Goals - 04/15/16 0807      PT LONG TERM GOAL #1   Title Pain free resistive wrist extension and grip testing Rt UE 05/22/16   Time 6   Period Weeks   Status On-going     PT LONG TERM GOAL #2   Title Patient to report ability to shake hands with minimal pain Rt UE 05/22/16   Time 6   Period Weeks   Status On-going     PT LONG TERM GOAL #  3   Title Decreased tenderness and tightness extensor forearm/epicondyle 05/22/16   Time 6   Period Weeks   Status On-going     PT LONG TERM GOAL #4   Title Independent in HEP 05/22/16   Time 6   Period Weeks   Status On-going     PT LONG TERM GOAL #5   Title Improve FOTO to </= 33% limitation 05/22/16   Time 6   Period Weeks   Status On-going               Plan - 04/15/16 [id number]    Clinical Impression Statement Minimal change in symptoms to date. Symptoms are long standing and change may be gradual. 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    Rehab Potential Good   PT Frequency 2x / week   PT Duration 6 weeks   PT Treatment/Interventions Patient/family education;ADLs/Self Care Home Management;Cryotherapy;Electrical Stimulation;Iontophoresis 4mg /ml Dexamethasone;Moist Heat;Ultrasound;Dry needling;Manual techniques;Therapeutic activities;Therapeutic exercise   PT Next Visit Plan deep tissue/ myofacial release work through the Rt extensor forearm; no TND at patient's request; stretching; strengthening posterior shoulder girdle musculature; ergonomis  education    Consulted and Agree with Plan of Care Patient      Patient will benefit from skilled therapeutic intervention in order to improve the following deficits and impairments:  Postural dysfunction, Improper body mechanics, Pain, Impaired UE functional use, Increased fascial restricitons, Increased muscle spasms, Decreased activity tolerance  Visit Diagnosis: Lateral epicondylitis of right elbow  Other symptoms and signs involving the musculoskeletal system     Problem List Patient Active Problem List   Diagnosis Date Noted  . Lateral epicondylitis of right elbow 11/14/2015  . Obesity 02/03/2014  . Lumbar degenerative disc disease 09/03/2013  . Osteoarthritis, hand 12/24/2012  . Cervical radiculitis 07/30/2012  . KERATOSIS PILARIS 01/29/2011  . ASTHMA 08/18/2010  . DERMATITIS, ATOPIC 08/18/2010  . HYPERLIPIDEMIA 07/21/2010    Celyn Rober MinionP Holt PT, MPH  04/15/2016, 8:48 AM  [facility] [address] [place], [place], [address] Phone: [phone]   Fax:  [phone]  Name: Martin Kennedy MRN: [id number] Date of Birth: 11/19/1965

## 2016-04-18 ENCOUNTER — Encounter: Payer: Self-pay | Admitting: Rehabilitative and Restorative Service Providers"

## 2016-04-18 ENCOUNTER — Ambulatory Visit (INDEPENDENT_AMBULATORY_CARE_PROVIDER_SITE_OTHER): Payer: BLUE CROSS/BLUE SHIELD | Admitting: Rehabilitative and Restorative Service Providers"

## 2016-04-18 DIAGNOSIS — R29898 Other symptoms and signs involving the musculoskeletal system: Secondary | ICD-10-CM

## 2016-04-18 DIAGNOSIS — M7711 Lateral epicondylitis, right elbow: Secondary | ICD-10-CM | POA: Diagnosis not present

## 2016-04-18 NOTE — Therapy (Addendum)
[place], [place], [address] Phone: [phone]   Fax:  [phone]  Physical Therapy Treatment  Patient Details  Name: Nicholson Starace MRN: [id number] Date of Birth: [birth year] Referring Provider: Dr Lynne Leader   Encounter Date: 04/18/2016      PT End of Session - 04/18/16 0821    Visit Number 4   Number of Visits 12   Date for PT Re-Evaluation 05/22/16   PT Start Time 0804   PT Stop Time 0848   PT Time Calculation (min) 44 min   Activity Tolerance Patient tolerated treatment well      Past Medical History:  Diagnosis Date  . Asthma   . Hyperlipidemia     Past Surgical History:  Procedure Laterality Date  . BACK SURGERY      There were no vitals filed for this visit.      Subjective Assessment - 04/18/16 0824    Subjective No change in symptoms. Patient reports that he is frustrated with persistent symptoms. Discussed return to MD. Encouraged continued work on Designer, television/film set.    Pertinent History neck and back pain    Patient Stated Goals get rid of the elbow pain and use arm normally    Currently in Pain? No/denies                         [facility] Adult PT Treatment/Exercise - 04/18/16 0001      Self-Care   Self-Care --  discussion of ergonomic work changes      Shoulder Exercises: Prone   Other Prone Exercises W's; airplane; superman 10 reps 5 sec hold      Shoulder Exercises: Standing   Other Standing Exercises 3 way doorway stretch 30 sec x 3      Wrist Exercises   Other wrist exercises wrist extensor stretch to ulnar side 30 sec x 3    Other wrist exercises supine stretch for Rt UE - shd 90 deg abd/elbow ext; wrist flexed light fist 1 min x 2 reps   can feel stretch into extensor forearm      Ultrasound   Ultrasound Location Rt extensor forearm and lateral epicondyle   Ultrasound Parameters 1.5 w/cm2, 1 mHz; 100%, 5 min extensor forearm. .8  w/cm@; 1 mHz; 50% x 5 min lateral epicondyle   Ultrasound Goals Other (Comment);Pain  muscular tightness      Iontophoresis   Type of Iontophoresis Dexamethasone   Location Rt lateral epicondyle   Dose 120 m/amp   Time 8 hr     Manual Therapy   Soft tissue mobilization Rt extensor forearm, lateral epicondyle; distal arm at trigger points and area of muscular tightness  instrument assisted manual work   Myofascial Release deep tissue work through the forearm with contration of wrist extensors with deep pressure x 3-4 reps    Passive ROM Rt hand/wrist - extensor forearm stretch with PT assist pt supine                 PT Education - 04/18/16 0844    Education provided Yes   Education Details HEP   Person(s) Educated Patient   Methods Explanation;Demonstration;Tactile cues;Verbal cues;Handout   Comprehension Verbalized understanding;Returned demonstration;Verbal cues required;Tactile cues required             PT Long Term Goals - 04/15/16 0807      PT LONG TERM GOAL #1   Title Pain free resistive  wrist extension and grip testing Rt UE 05/22/16   Time 6   Period Weeks   Status On-going     PT LONG TERM GOAL #2   Title Patient to report ability to shake hands with minimal pain Rt UE 05/22/16   Time 6   Period Weeks   Status On-going     PT LONG TERM GOAL #3   Title Decreased tenderness and tightness extensor forearm/epicondyle 05/22/16   Time 6   Period Weeks   Status On-going     PT LONG TERM GOAL #4   Title Independent in HEP 05/22/16   Time 6   Period Weeks   Status On-going     PT LONG TERM GOAL #5   Title Improve FOTO to </= 33% limitation 05/22/16   Time 6   Period Weeks   Status On-going               Plan - 04/18/16 5498    Clinical Impression Statement Continued symptoms with little change. Thinking of ergonomis changes for work. Cotninued muscular tightness through the lateral epicondyle and surrounding tissues. Discussed RTD. Will  continued trial of ionto, stretches, deep tissue work.    Rehab Potential Good   PT Frequency 2x / week   PT Duration 6 weeks   PT Treatment/Interventions Patient/family education;ADLs/Self Care Home Management;Cryotherapy;Electrical Stimulation;Iontophoresis 4m/ml Dexamethasone;Moist Heat;Ultrasound;Dry needling;Manual techniques;Therapeutic activities;Therapeutic exercise   PT Next Visit Plan deep tissue/ myofacial release work through the Rt extensor forearm; no TND at patient's request; stretching; strengthening posterior shoulder girdle musculature; ergonomics education; assess response to instrument assisted manual work    Consulted and Agree with Plan of Care Patient      Patient will benefit from skilled therapeutic intervention in order to improve the following deficits and impairments:  Postural dysfunction, Improper body mechanics, Pain, Impaired UE functional use, Increased fascial restricitons, Increased muscle spasms, Decreased activity tolerance  Visit Diagnosis: Lateral epicondylitis of right elbow  Other symptoms and signs involving the musculoskeletal system     Problem List Patient Active Problem List   Diagnosis Date Noted  . Lateral epicondylitis of right elbow 11/14/2015  . Obesity 02/03/2014  . Lumbar degenerative disc disease 09/03/2013  . Osteoarthritis, hand 12/24/2012  . Cervical radiculitis 07/30/2012  . KERATOSIS PILARIS 01/29/2011  . ASTHMA 08/18/2010  . DERMATITIS, ATOPIC 08/18/2010  . HYPERLIPIDEMIA 07/21/2010    Celyn PNilda SimmerPT, MPH  04/18/2016, 8:48 AM  [facility] [address] [address]Phone: [phone]  Fax:  [phone] Name: DWeylyn RicciutiMRN: [id number] of Birth: 103/21/1967 PHYSICAL THERAPY DISCHARGE SUMMARY  Visits from Start of Care: 4  Current functional level related to goals / functional outcomes: Patient reports continued pain with  minimal changes   Remaining deficits: No significant change    Education / Equipment: HEP Plan: Patient agrees to discharge.  Patient goals were not met. Patient is being discharged due to not returning since the last visit.  ?????    Celyn P. HHelene KelpPT, MPH 05/29/16 2:40 PM

## 2016-04-18 NOTE — Patient Instructions (Addendum)
  Triceps, Overhead I   Can use doorway to assist with stretch  With other hand cupping opposite elbow, bring arm over head and bend elbow as far as possible. Use other hand to gently stretch further. Hold _30__ seconds. Repeat _3__ times per session. Do _2-3__ sessions per day.

## 2016-05-24 ENCOUNTER — Other Ambulatory Visit: Payer: Self-pay | Admitting: Orthopedic Surgery

## 2016-05-24 DIAGNOSIS — M5416 Radiculopathy, lumbar region: Secondary | ICD-10-CM

## 2016-05-28 ENCOUNTER — Ambulatory Visit
Admission: RE | Admit: 2016-05-28 | Discharge: 2016-05-28 | Disposition: A | Discharge status: Home / Self Care | Payer: BLUE CROSS/BLUE SHIELD | Source: Ambulatory Visit | Attending: Orthopedic Surgery | Admitting: Orthopedic Surgery

## 2016-05-28 DIAGNOSIS — M5416 Radiculopathy, lumbar region: Secondary | ICD-10-CM

## 2016-05-28 DIAGNOSIS — M47817 Spondylosis without myelopathy or radiculopathy, lumbosacral region: Secondary | ICD-10-CM | POA: Diagnosis not present

## 2016-05-28 MED ORDER — METHYLPREDNISOLONE ACETATE 40 MG/ML INJ SUSP (RADIOLOG
120.0000 mg | Freq: Once | INTRAMUSCULAR | Status: AC
  Administered 2016-05-28: 120 mg via EPIDURAL

## 2016-05-28 MED ORDER — IOPAMIDOL (ISOVUE-M 200) INJECTION 41%
1.0000 mL | Freq: Once | INTRAMUSCULAR | Status: AC
  Administered 2016-05-28: 1 mL via EPIDURAL

## 2016-09-19 DIAGNOSIS — M5136 Other intervertebral disc degeneration, lumbar region: Secondary | ICD-10-CM | POA: Diagnosis not present

## 2016-09-19 DIAGNOSIS — M545 Low back pain: Secondary | ICD-10-CM | POA: Diagnosis not present

## 2016-09-19 DIAGNOSIS — M961 Postlaminectomy syndrome, not elsewhere classified: Secondary | ICD-10-CM | POA: Diagnosis not present

## 2017-03-11 ENCOUNTER — Ambulatory Visit (INDEPENDENT_AMBULATORY_CARE_PROVIDER_SITE_OTHER): Payer: BLUE CROSS/BLUE SHIELD | Admitting: Sports Medicine

## 2017-03-11 ENCOUNTER — Encounter: Payer: Self-pay | Admitting: Sports Medicine

## 2017-03-11 DIAGNOSIS — Z0000 Encounter for general adult medical examination without abnormal findings: Secondary | ICD-10-CM | POA: Diagnosis not present

## 2017-03-11 DIAGNOSIS — M51369 Other intervertebral disc degeneration, lumbar region without mention of lumbar back pain or lower extremity pain: Secondary | ICD-10-CM

## 2017-03-11 DIAGNOSIS — F411 Generalized anxiety disorder: Secondary | ICD-10-CM | POA: Diagnosis not present

## 2017-03-11 DIAGNOSIS — M5136 Other intervertebral disc degeneration, lumbar region: Secondary | ICD-10-CM

## 2017-03-11 NOTE — Patient Instructions (Signed)
Look into low-dose Cymbalta for radicular symptoms

## 2017-03-11 NOTE — Assessment & Plan Note (Signed)
Declines treatment for now.  Mild generalized anxiety.

## 2017-03-11 NOTE — Assessment & Plan Note (Signed)
Checking routine blood work, he would like to defer colon cancer screening for now, declines vaccinations. Risks and benefits explained.

## 2017-03-11 NOTE — Assessment & Plan Note (Signed)
He does have degenerative disc disease, no response to gabapentin or Lyrica. I have recommended to try Cymbalta. He has seen Dr. Lynann Bologna and Dr. Mina Marble at [facility]. Nerve conduction/EMG was negative. 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 Because he has some hip girdle pain and weakness I am going to add an ESR and a CK to his labs.

## 2017-03-11 NOTE — Progress Notes (Signed)
  Subjective:    CC: Anxiety  HPI: This is a pleasant 51 year old male, he is been battling anxiety for some time now.  He has been resistant to treating it, recently has had a few episodes of panic and chest pain that ended him up in the emergency department, he had his enzymes cycled, as well as a stress test that was negative.  Gets occasional episodes of panic with shortness of breath, chest pain but continues to decline pharmacotherapy or behavioral therapy.  Back pain: Degenerative disc disease, history of a decompressive laminectomy, does not really have any pain but more paresthesias going down the legs.  He had a nerve conduction study that was negative, he is tried gabapentin and Lyrica without relief, and he is resistant to trying any other medications for now.  Preventive measures: Declines flu shot, other vaccinations, declines colon cancer screening for now but tells me he will get back to [place] on it.  Past medical history:  Negative.  See flowsheet/record as well for more information.  Surgical history: Negative.  See flowsheet/record as well for more information.  Family history: Negative.  See flowsheet/record as well for more information.  Social history: Negative.  See flowsheet/record as well for more information.  Allergies, and medications have been entered into the medical record, reviewed, and no changes needed.   Review of Systems: No fevers, chills, night sweats, weight loss, chest pain, or shortness of breath.   Objective:    General: Well Developed, well nourished, and in no acute distress.  Neuro: Alert and oriented x3, extra-ocular muscles intact, sensation grossly intact.  HEENT: Normocephalic, atraumatic, pupils equal round reactive to light, neck supple, no masses, no lymphadenopathy, thyroid nonpalpable.  Skin: Warm and dry, no rashes. Cardiac: Regular rate and rhythm, no murmurs rubs or gallops, no lower extremity edema.  Respiratory: Clear to auscultation  bilaterally. Not using accessory muscles, speaking in full sentences.  Impression and Recommendations:    Annual physical exam Checking routine blood work, he would like to defer colon cancer screening for now, declines vaccinations. Risks and benefits explained.  Lumbar degenerative disc disease He does have degenerative disc disease, no response to gabapentin or Lyrica. I have recommended to try Cymbalta. He has seen Dr. Lynann Bologna and Dr. Mina Marble at [facility]. Nerve conduction/EMG was negative. 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 Because he has some hip girdle pain and weakness I am going to add an ESR and a CK to his labs.  Generalized anxiety disorder Declines treatment for now.  Mild generalized anxiety.  I spent 25 minutes with this patient, greater than 50% was face-to-face time counseling regarding the above diagnoses ___________________________________________ Gwen Her. Dianah Field, M.D., ABFM., CAQSM. Primary Care and [place] Instructor of [place] [employer] [facility] of [employer]

## 2017-03-12 ENCOUNTER — Other Ambulatory Visit: Payer: Self-pay | Admitting: Sports Medicine

## 2017-03-12 LAB — LIPID PANEL W/REFLEX DIRECT LDL
Cholesterol: 176 mg/dL (ref <200)
HDL: 27 mg/dL — ABNORMAL LOW (ref >40)
LDL Cholesterol (Calc): 123 mg/dL (calc) — ABNORMAL HIGH
Non-HDL Cholesterol (Calc): 149 mg/dL — ABNORMAL HIGH (ref <130)
Total CHOL/HDL Ratio: 6.5 (calc) — ABNORMAL HIGH (ref <5.0)
Triglycerides: 148 mg/dL (ref <150)

## 2017-03-12 LAB — COMPREHENSIVE METABOLIC PANEL WITH GFR
AG Ratio: 1.7 (calc) (ref 1.0–2.5)
ALT: 31 U/L (ref 9–46)
AST: 19 U/L (ref 10–35)
Alkaline phosphatase (APISO): 60 U/L (ref 40–115)
CO2: 28 mmol/L (ref 20–32)
Calcium: 9.9 mg/dL (ref 8.6–10.3)
Chloride: 107 mmol/L (ref 98–110)
Globulin: 2.8 g/dL (ref 1.9–3.7)
Glucose, Bld: 108 mg/dL — ABNORMAL HIGH (ref 65–99)
Total Bilirubin: 0.7 mg/dL (ref 0.2–1.2)

## 2017-03-12 LAB — CBC
HCT: 46 % (ref 38.5–50.0)
Hemoglobin: 15.6 g/dL (ref 13.2–17.1)
MCH: 29.7 pg (ref 27.0–33.0)
MCHC: 33.9 g/dL (ref 32.0–36.0)
MCV: 87.6 fL (ref 80.0–100.0)
MPV: 10.3 fL (ref 7.5–12.5)
Platelets: 244 10*3/uL (ref 140–400)
RBC: 5.25 10*6/uL (ref 4.20–5.80)
RDW: 13 % (ref 11.0–15.0)
WBC: 7.6 Thousand/uL (ref 3.8–10.8)

## 2017-03-12 LAB — COMPREHENSIVE METABOLIC PANEL
Albumin: 4.8 g/dL (ref 3.6–5.1)
BUN: 18 mg/dL (ref 7–25)
Creat: 0.99 mg/dL (ref 0.70–1.33)
Potassium: 4.5 mmol/L (ref 3.5–5.3)
Sodium: 145 mmol/L (ref 135–146)
Total Protein: 7.6 g/dL (ref 6.1–8.1)

## 2017-03-12 LAB — TSH: TSH: 3.02 mIU/L (ref 0.40–4.50)

## 2017-03-12 LAB — HEMOGLOBIN A1C
Hgb A1c MFr Bld: 5.7 %{Hb} — ABNORMAL HIGH (ref <5.7)
Mean Plasma Glucose: 117 (calc)
eAG (mmol/L): 6.5 (calc)

## 2017-03-12 LAB — HIV ANTIBODY (ROUTINE TESTING W REFLEX): HIV 1&2 Ab, 4th Generation: NONREACTIVE

## 2017-03-12 LAB — CK: Total CK: 97 U/L (ref 44–196)

## 2017-03-12 LAB — VITAMIN D 25 HYDROXY (VIT D DEFICIENCY, FRACTURES): Vit D, 25-Hydroxy: 27 ng/mL — ABNORMAL LOW (ref 30–100)

## 2017-03-12 LAB — SEDIMENTATION RATE: Sed Rate: 2 mm/h (ref 0–15)

## 2017-03-12 MED ORDER — VITAMIN D (ERGOCALCIFEROL) 1.25 MG (50000 UNIT) PO CAPS: 50000.0000 [IU] | ORAL_CAPSULE | ORAL | 0 refills | Status: DC

## 2017-06-13 DIAGNOSIS — M5136 Other intervertebral disc degeneration, lumbar region: Secondary | ICD-10-CM | POA: Diagnosis not present

## 2017-06-13 DIAGNOSIS — M545 Low back pain: Secondary | ICD-10-CM | POA: Diagnosis not present

## 2017-06-23 DIAGNOSIS — M545 Low back pain: Secondary | ICD-10-CM | POA: Diagnosis not present

## 2017-06-23 DIAGNOSIS — M4317 Spondylolisthesis, lumbosacral region: Secondary | ICD-10-CM | POA: Diagnosis not present

## 2017-06-23 DIAGNOSIS — M9973 Connective tissue and disc stenosis of intervertebral foramina of lumbar region: Secondary | ICD-10-CM | POA: Diagnosis not present

## 2017-06-23 DIAGNOSIS — M4316 Spondylolisthesis, lumbar region: Secondary | ICD-10-CM | POA: Diagnosis not present

## 2017-06-23 DIAGNOSIS — M47892 Other spondylosis, cervical region: Secondary | ICD-10-CM | POA: Diagnosis not present

## 2017-06-23 DIAGNOSIS — M9971 Connective tissue and disc stenosis of intervertebral foramina of cervical region: Secondary | ICD-10-CM | POA: Diagnosis not present

## 2017-06-23 DIAGNOSIS — M542 Cervicalgia: Secondary | ICD-10-CM | POA: Diagnosis not present

## 2017-06-23 DIAGNOSIS — M47896 Other spondylosis, lumbar region: Secondary | ICD-10-CM | POA: Diagnosis not present

## 2017-06-23 DIAGNOSIS — R200 Anesthesia of skin: Secondary | ICD-10-CM | POA: Diagnosis not present

## 2017-07-10 DIAGNOSIS — G5600 Carpal tunnel syndrome, unspecified upper limb: Secondary | ICD-10-CM | POA: Diagnosis not present

## 2017-07-10 DIAGNOSIS — M4307 Spondylolysis, lumbosacral region: Secondary | ICD-10-CM | POA: Diagnosis not present

## 2017-07-10 DIAGNOSIS — M545 Low back pain: Secondary | ICD-10-CM | POA: Diagnosis not present

## 2017-07-10 DIAGNOSIS — M542 Cervicalgia: Secondary | ICD-10-CM | POA: Diagnosis not present

## 2017-07-23 DIAGNOSIS — M545 Low back pain: Secondary | ICD-10-CM | POA: Diagnosis not present

## 2017-07-23 DIAGNOSIS — R6889 Other general symptoms and signs: Secondary | ICD-10-CM | POA: Diagnosis not present

## 2017-10-31 ENCOUNTER — Encounter: Payer: Self-pay | Admitting: Emergency Medicine

## 2017-10-31 ENCOUNTER — Other Ambulatory Visit: Payer: Self-pay

## 2017-10-31 ENCOUNTER — Emergency Department (INDEPENDENT_AMBULATORY_CARE_PROVIDER_SITE_OTHER)
Admission: EM | Admit: 2017-10-31 | Discharge: 2017-10-31 | Disposition: A | Discharge status: Home / Self Care | Payer: BLUE CROSS/BLUE SHIELD | Source: Home / Self Care | Attending: Family Medicine | Admitting: Family Medicine

## 2017-10-31 DIAGNOSIS — M7662 Achilles tendinitis, left leg: Secondary | ICD-10-CM | POA: Diagnosis not present

## 2017-10-31 MED ORDER — NITROGLYCERIN 0.2 MG/HR TD PT24: MEDICATED_PATCH | TRANSDERMAL | 11 refills | Status: DC

## 2017-10-31 NOTE — Assessment & Plan Note (Signed)
Classic Achilles tendinosis, cam boot for now, when he comes back we will do topical nitroglycerin, heel lifts and set him up with eccentric rehabilitation.

## 2017-10-31 NOTE — Discharge Instructions (Signed)
°  Please call to schedule a follow up appointment with Dr. Benjamin Stainhekkekandam next week.

## 2017-10-31 NOTE — ED Provider Notes (Addendum)
[facility]    CSN: [id number] Arrival date & time: 10/31/17  0802     History   Chief Complaint Chief Complaint  Patient presents with  . Ankle Pain    HPI Martin Kennedy is a 52 y.o. male.   HPI Martin Kennedy is a 52 y.o. male presenting to UC with c/o chronic Left ankle pain that was treated by Dr. Benjamin Stain, Sports Medicine, about 3 years ago.  Now, for the last 6-8 weeks he has noticed mild soreness and a small nodule over his achilles that started after he went hiking in the [place] several weeks ago.  He also reports stiffness in his achilles tendons in the morning.  The stiffness gradually improves throughout the day but the soreness worsens with walking.  He is leaving for a trip to [place]. Today and plans to do a lot of walking but is concerned he may need a walking boot to help prevent further damage of his achilles tendon.  He has not taken anything for pain today as it is minimal.  No specific known injury.     Past Medical History:  Diagnosis Date  . Asthma   . Hyperlipidemia     Patient Active Problem List   Diagnosis Date Noted  . Achilles tendinitis, left leg 10/31/2017  . Annual physical exam 03/11/2017  . Generalized anxiety disorder 03/11/2017  . Lateral epicondylitis of right elbow 11/14/2015  . Obesity 02/03/2014  . Lumbar degenerative disc disease 09/03/2013  . Osteoarthritis, hand 12/24/2012  . Cervical radiculitis 07/30/2012  . KERATOSIS PILARIS 01/29/2011  . ASTHMA 08/18/2010  . DERMATITIS, ATOPIC 08/18/2010  . HYPERLIPIDEMIA 07/21/2010    Past Surgical History:  Procedure Laterality Date  . BACK SURGERY         Home Medications    Prior to Admission medications   Medication Sig Start Date End Date Taking? Authorizing Provider  nitroGLYCERIN (NITRODUR - DOSED IN MG/24 HR) 0.2 mg/hr patch Cut and apply 1/4 patch to most painful area q24h. 10/31/17   Monica Becton, MD  Vitamin D, Ergocalciferol,  (DRISDOL) 50000 units CAPS capsule Take 1 capsule (50,000 Units total) by mouth every 7 (seven) days. Take for 8 total doses(weeks) 03/12/17   Monica Becton, MD    Family History No family history on file.  Social History Social History   Tobacco Use  . Smoking status: Never Smoker  . Smokeless tobacco: Never Used  Substance Use Topics  . Alcohol use: Yes  . Drug use: No     Allergies   Erythromycin   Review of Systems Review of Systems  Musculoskeletal: Positive for arthralgias and joint swelling. Negative for gait problem and myalgias.  Skin: Negative for color change and wound.     Physical Exam Triage Vital Signs ED Triage Vitals  Enc Vitals Group     BP 10/31/17 0823 133/78     Pulse Rate 10/31/17 0823 68     Resp 10/31/17 0823 18     Temp 10/31/17 0823 97.9 F (36.6 C)     Temp Source 10/31/17 0823 Oral     SpO2 10/31/17 0823 96 %     Weight 10/31/17 0824 245 lb (111.1 kg)     Height 10/31/17 0824 6' (1.829 m)     Head Circumference --      Peak Flow --      Pain Score 10/31/17 0824 5     Pain Loc --      Pain  Edu? --      Excl. in GC? --    No data found.  Updated Vital Signs BP 133/78 (BP Location: Right Arm)   Pulse 68   Temp 97.9 F (36.6 C) (Oral)   Resp 18   Ht 6' (1.829 m)   Wt 245 lb (111.1 kg)   SpO2 96%   BMI 33.23 kg/m   Visual Acuity Right Eye Distance:   Left Eye Distance:   Bilateral Distance:    Right Eye Near:   Left Eye Near:    Bilateral Near:     Physical Exam  Constitutional: He is oriented to person, place, and time. He appears well-developed and well-nourished.  HENT:  Head: Normocephalic and atraumatic.  Eyes: EOM are normal.  Neck: Normal range of motion.  Cardiovascular: Normal rate.  Pulmonary/Chest: Effort normal.  Musculoskeletal: Normal range of motion. He exhibits edema and tenderness.  Left ankle: mild swelling/nodule over middle of achilles tendon. Full dorsiflexion and plantarflexion.    Calf is soft, non-tender.  Normal gait.   Neurological: He is alert and oriented to person, place, and time.  Skin: Skin is warm and dry.  Left ankle: skin in tact. No ecchymosis or erythema.   Psychiatric: He has a normal mood and affect. His behavior is normal.  Nursing note and vitals reviewed.    UC Treatments / Results  Labs (all labs ordered are listed, but only abnormal results are displayed) Labs Reviewed - No data to display  EKG None  Radiology No results found.  Procedures Procedures (including critical care time)  Medications Ordered in UC Medications - No data to display  Initial Impression / Assessment and Plan / UC Course  I have reviewed the triage vital signs and the nursing notes.  Pertinent labs & imaging results that were available during my care of the patient were reviewed by me and considered in my medical decision making (see chart for details).    No evidence of underlying infection. No specific known injury. Consulted with Dr. Benjamin Stainhekkekandam, who also examined pt. Will have pt wear walking boot and f/u in his office in 1-2 weeks.   Final Clinical Impressions(s) / UC Diagnoses   Final diagnoses:  Tendonitis, Achilles, left     Discharge Instructions      Please call to schedule a follow up appointment with Dr. Benjamin Stainhekkekandam next week.      ED Prescriptions    Medication Sig Dispense Auth. Provider   nitroGLYCERIN (NITRODUR - DOSED IN MG/24 HR) 0.2 mg/hr patch Cut and apply 1/4 patch to most painful area q24h. 30 patch Monica Bectonhekkekandam, Thomas J, MD     Controlled Substance Prescriptions [place] Controlled Substance Registry consulted? Not Applicable   Rolla Platehelps, Hazyl Marseille O, PA-C 10/31/17 0906    Lurene ShadowPhelps, Vermelle Cammarata O, PA-C 10/31/17 [phone]

## 2017-10-31 NOTE — Consult Note (Signed)
Subjective:    CC: Left Achilles injury  HPI: For the past 6 weeks this 52 year old male has had pain that he localizes over the posterior aspect of his ankle, somewhat proximal to the Achilles insertion, this occurred after several weeks of hiking.  Pain is moderate, persistent, localized without radiation, he does have a trip to [place] up.  I reviewed the past medical history, family history, social history, surgical history, and allergies today and no changes were needed.  Please see the problem list section below in epic for further details.  Past Medical History: Past Medical History:  Diagnosis Date  . Asthma   . Hyperlipidemia    Past Surgical History: Past Surgical History:  Procedure Laterality Date  . BACK SURGERY     Social History: Social History   Socioeconomic History  . Marital status: Married    Spouse name: Not on file  . Number of children: Not on file  . Years of education: Not on file  . Highest education level: Not on file  Occupational History  . Not on file  Social Needs  . Financial resource strain: Not on file  . Food insecurity:    Worry: Not on file    Inability: Not on file  . Transportation needs:    Medical: Not on file    Non-medical: Not on file  Tobacco Use  . Smoking status: Never Smoker  . Smokeless tobacco: Never Used  Substance and Sexual Activity  . Alcohol use: Yes  . Drug use: No  . Sexual activity: Not on file  Lifestyle  . Physical activity:    Days per week: Not on file    Minutes per session: Not on file  . Stress: Not on file  Relationships  . Social connections:    Talks on phone: Not on file    Gets together: Not on file    Attends religious service: Not on file    Active member of club or organization: Not on file    Attends meetings of clubs or organizations: Not on file    Relationship status: Not on file  Other Topics Concern  . Not on file  Social History Narrative  . Not on file    Family History: No family history on file. Allergies: Allergies  Allergen Reactions  . Erythromycin    Medications: See med rec.  Review of Systems: No fevers, chills, night sweats, weight loss, chest pain, or shortness of breath.   Objective:    General: Well Developed, well nourished, and in no acute distress.  Neuro: Alert and oriented x3, extra-ocular muscles intact, sensation grossly intact.  HEENT: Normocephalic, atraumatic, pupils equal round reactive to light, neck supple, no masses, no lymphadenopathy, thyroid nonpalpable.  Skin: Warm and dry, no rashes. Cardiac: Regular rate and rhythm, no murmurs rubs or gallops, no lower extremity edema.  Respiratory: Clear to auscultation bilaterally. Not using accessory muscles, speaking in full sentences. Left ankle: No visible erythema or swelling. Range of motion is full in all directions. Strength is 5/5 in all directions. Stable lateral and medial ligaments; squeeze test and kleiger test unremarkable; Talar dome nontender; No pain at base of 5th MT; No tenderness over cuboid; No tenderness over N spot or navicular prominence No tenderness on posterior aspects of lateral and medial malleolus No sign of peroneal tendon subluxations; Negative tarsal tunnel tinel's Visible and palpable tender Achilles nodule, negative Thompson's test.  Impression and Recommendations:    Achilles tendinitis, left leg  Classic Achilles tendinosis, cam boot for now, when he comes back we will do topical nitroglycerin, heel lifts and set him up with eccentric rehabilitation. ___________________________________________ Ihor Austin. Benjamin Stain, M.D., ABFM., CAQSM. Primary Care and Sports Medicine [facility]  Adjunct Instructor of Family Medicine  [employer]

## 2017-10-31 NOTE — ED Triage Notes (Signed)
Reports some chronic ankle problems treated by Dr.T about 3 years ago; now for past 6-8 weeks has noticed edema over left achilles with varying amount of discomfort. No OTCs.

## 2017-11-02 ENCOUNTER — Telehealth: Payer: Self-pay | Admitting: Emergency Medicine

## 2017-11-02 NOTE — Telephone Encounter (Signed)
Spoke with patient via telephone R/T his visit, he states he is out of town for two weeks but will schedule a follow up appointment when he returns home.

## 2017-11-13 ENCOUNTER — Ambulatory Visit (INDEPENDENT_AMBULATORY_CARE_PROVIDER_SITE_OTHER): Payer: BLUE CROSS/BLUE SHIELD | Admitting: Sports Medicine

## 2017-11-13 ENCOUNTER — Encounter: Payer: Self-pay | Admitting: Sports Medicine

## 2017-11-13 DIAGNOSIS — M7662 Achilles tendinitis, left leg: Secondary | ICD-10-CM

## 2017-11-13 NOTE — Progress Notes (Signed)
Subjective:    CC: Recheck ankle  HPI: This is a pleasant 52 year old male, we saw him in the urgent care about a week and a half ago with left Achilles tendinosis, he has not yet started his nitroglycerin patches.  Overall he is doing okay, here to discuss and recheck.  I reviewed the past medical history, family history, social history, surgical history, and allergies today and no changes were needed.  Please see the problem list section below in epic for further details.  Past Medical History: Past Medical History:  Diagnosis Date  . Asthma   . Hyperlipidemia    Past Surgical History: Past Surgical History:  Procedure Laterality Date  . BACK SURGERY     Social History: Social History   Socioeconomic History  . Marital status: Married    Spouse name: Not on file  . Number of children: Not on file  . Years of education: Not on file  . Highest education level: Not on file  Occupational History  . Not on file  Social Needs  . Financial resource strain: Not on file  . Food insecurity:    Worry: Not on file    Inability: Not on file  . Transportation needs:    Medical: Not on file    Non-medical: Not on file  Tobacco Use  . Smoking status: Never Smoker  . Smokeless tobacco: Never Used  Substance and Sexual Activity  . Alcohol use: Yes  . Drug use: No  . Sexual activity: Not on file  Lifestyle  . Physical activity:    Days per week: Not on file    Minutes per session: Not on file  . Stress: Not on file  Relationships  . Social connections:    Talks on phone: Not on file    Gets together: Not on file    Attends religious service: Not on file    Active member of club or organization: Not on file    Attends meetings of clubs or organizations: Not on file    Relationship status: Not on file  Other Topics Concern  . Not on file  Social History Narrative  . Not on file   Family History: No family history on file. Allergies: Allergies  Allergen Reactions  .  Erythromycin    Medications: See med rec.  Review of Systems: No fevers, chills, night sweats, weight loss, chest pain, or shortness of breath.   Objective:    General: Well Developed, well nourished, and in no acute distress.  Neuro: Alert and oriented x3, extra-ocular muscles intact, sensation grossly intact.  HEENT: Normocephalic, atraumatic, pupils equal round reactive to light, neck supple, no masses, no lymphadenopathy, thyroid nonpalpable.  Skin: Warm and dry, no rashes. Cardiac: Regular rate and rhythm, no murmurs rubs or gallops, no lower extremity edema.  Respiratory: Clear to auscultation bilaterally. Not using accessory muscles, speaking in full sentences. Left ankle: No visible erythema or swelling. Range of motion is full in all directions. Strength is 5/5 in all directions. Stable lateral and medial ligaments; squeeze test and kleiger test unremarkable; Talar dome nontender; No pain at base of 5th MT; No tenderness over cuboid; No tenderness over N spot or navicular prominence No tenderness on posterior aspects of lateral and medial malleolus No sign of peroneal tendon subluxations; Negative tarsal tunnel tinel's Able to walk 4 steps. Tender to palpation over the Achilles nodule.  Impression and Recommendations:    Achilles tendinitis, left leg Heel lifts, topical nitroglycerin patches. Eccentric rehabilitation  exercises given. Patient understands this can take 8 weeks or longer to heal. Return in 4 weeks. ___________________________________________ Ihor Austinhomas J. Benjamin Stainhekkekandam, M.D., ABFM., CAQSM. Primary Care and Sports Medicine [facility]  Adjunct Instructor of Family Medicine  [employer]

## 2017-11-13 NOTE — Patient Instructions (Signed)
Begin with easy walking, heel, toe and backwards  Do calf raises on a step:  First lower and then raise on 1 foot  If this is painful, lower on 1 foot, do the heel raise on both feet Begin with 3 sets of 10 repetitions  Increase by 5 repetitions every 3 days  Goal is 3 sets of 30 repetitions  Do with both straight knee and knee at 20 degrees of flexion  If pain persists, once you can do 3 sets of 30 without weight, add backpack with 5 lbs.  Increase by 5 lbs per week to max of 30 lbs for 3 sets of 15   

## 2017-11-13 NOTE — Assessment & Plan Note (Signed)
Heel lifts, topical nitroglycerin patches. Eccentric rehabilitation exercises given. Patient understands this can take 8 weeks or longer to heal. Return in 4 weeks.

## 2017-11-17 ENCOUNTER — Ambulatory Visit: Payer: BLUE CROSS/BLUE SHIELD | Admitting: Sports Medicine

## 2017-12-22 ENCOUNTER — Ambulatory Visit (INDEPENDENT_AMBULATORY_CARE_PROVIDER_SITE_OTHER): Payer: BLUE CROSS/BLUE SHIELD | Admitting: Sports Medicine

## 2017-12-22 ENCOUNTER — Encounter: Payer: Self-pay | Admitting: Sports Medicine

## 2017-12-22 DIAGNOSIS — M7662 Achilles tendinitis, left leg: Secondary | ICD-10-CM | POA: Diagnosis not present

## 2017-12-22 DIAGNOSIS — M1612 Unilateral primary osteoarthritis, left hip: Secondary | ICD-10-CM | POA: Insufficient documentation

## 2017-12-22 DIAGNOSIS — M76892 Other specified enthesopathies of left lower limb, excluding foot: Secondary | ICD-10-CM

## 2017-12-22 NOTE — Assessment & Plan Note (Signed)
Rehab exercises, over-the-counter analgesics. Return as needed.

## 2017-12-22 NOTE — Progress Notes (Signed)
Subjective:    CC: Follow-up  HPI: Achilles tendinosis: Has not really improved much, he has been doing 40 to 50 miles of hiking, and he never did the nitroglycerin patches.  He is doing his rehabilitation exercises, symptoms are moderate, persistent, localized without radiation.  Left hip pain: Anterior, worse with flexion of his left hip.  I reviewed the past medical history, family history, social history, surgical history, and allergies today and no changes were needed.  Please see the problem list section below in epic for further details.  Past Medical History: Past Medical History:  Diagnosis Date  . Asthma   . Hyperlipidemia    Past Surgical History: Past Surgical History:  Procedure Laterality Date  . BACK SURGERY     Social History: Social History   Socioeconomic History  . Marital status: Married    Spouse name: Not on file  . Number of children: Not on file  . Years of education: Not on file  . Highest education level: Not on file  Occupational History  . Not on file  Social Needs  . Financial resource strain: Not on file  . Food insecurity:    Worry: Not on file    Inability: Not on file  . Transportation needs:    Medical: Not on file    Non-medical: Not on file  Tobacco Use  . Smoking status: Never Smoker  . Smokeless tobacco: Never Used  Substance and Sexual Activity  . Alcohol use: Yes  . Drug use: No  . Sexual activity: Not on file  Lifestyle  . Physical activity:    Days per week: Not on file    Minutes per session: Not on file  . Stress: Not on file  Relationships  . Social connections:    Talks on phone: Not on file    Gets together: Not on file    Attends religious service: Not on file    Active member of club or organization: Not on file    Attends meetings of clubs or organizations: Not on file    Relationship status: Not on file  Other Topics Concern  . Not on file  Social History Narrative  . Not on file   Family  History: No family history on file. Allergies: Allergies  Allergen Reactions  . Erythromycin    Medications: See med rec.  Review of Systems: No fevers, chills, night sweats, weight loss, chest pain, or shortness of breath.   Objective:    General: Well Developed, well nourished, and in no acute distress.  Neuro: Alert and oriented x3, extra-ocular muscles intact, sensation grossly intact.  HEENT: Normocephalic, atraumatic, pupils equal round reactive to light, neck supple, no masses, no lymphadenopathy, thyroid nonpalpable.  Skin: Warm and dry, no rashes. Cardiac: Regular rate and rhythm, no murmurs rubs or gallops, no lower extremity edema.  Respiratory: Clear to auscultation bilaterally. Not using accessory muscles, speaking in full sentences. Left ankle: Visible Achilles nodule, minimally tender to palpation. Range of motion is full in all directions. Strength is 5/5 in all directions. Stable lateral and medial ligaments; squeeze test and kleiger test unremarkable; Talar dome nontender; No pain at base of 5th MT; No tenderness over cuboid; No tenderness over N spot or navicular prominence No tenderness on posterior aspects of lateral and medial malleolus No sign of peroneal tendon subluxations; Negative tarsal tunnel tinel's Able to walk 4 steps. Left hip: ROM IR: 60 Deg, ER: 60 Deg, Flexion: 120 Deg, Extension: 100 Deg, Abduction: 45  Deg, Adduction: 45 Deg Strength IR: 5/5, ER: 5/5, Flexion: 5/5 with reproduction of pain, Extension: 5/5, Abduction: 5/5, Adduction: 5/5 Pelvic alignment unremarkable to inspection and palpation. Standing hip rotation and gait without trendelenburg / unsteadiness. Greater trochanter without tenderness to palpation. No tenderness over piriformis. No SI joint tenderness and normal minimal SI movement.  Impression and Recommendations:    Achilles tendinitis, left leg Its been 1 month, only minimal improvement, he did not end up using  topical nitroglycerin patches. We will do a single session of PT for further teaching of eccentric rehabilitation. Understands it can take 6 to 8 weeks to start getting better. He will cut his hiking in half for now, return in 1 month.  Hip flexor tendinitis, left Rehab exercises, over-the-counter analgesics. Return as needed.   ___________________________________________ Ihor Austinhomas J. Benjamin Stainhekkekandam, M.D., ABFM., CAQSM. Primary Care and Sports Medicine [facility]  Adjunct Instructor of Family Medicine  [employer]

## 2017-12-22 NOTE — Assessment & Plan Note (Signed)
Its been 1 month, only minimal improvement, he did not end up using topical nitroglycerin patches. We will do a single session of PT for further teaching of eccentric rehabilitation. Understands it can take 6 to 8 weeks to start getting better. He will cut his hiking in half for now, return in 1 month.

## 2017-12-31 ENCOUNTER — Encounter: Payer: Self-pay | Admitting: Rehabilitative and Restorative Service Providers"

## 2017-12-31 ENCOUNTER — Ambulatory Visit (INDEPENDENT_AMBULATORY_CARE_PROVIDER_SITE_OTHER): Payer: BLUE CROSS/BLUE SHIELD | Admitting: Rehabilitative and Restorative Service Providers"

## 2017-12-31 DIAGNOSIS — M79672 Pain in left foot: Secondary | ICD-10-CM | POA: Diagnosis not present

## 2017-12-31 DIAGNOSIS — R29898 Other symptoms and signs involving the musculoskeletal system: Secondary | ICD-10-CM | POA: Diagnosis not present

## 2017-12-31 NOTE — Patient Instructions (Signed)
HIP: Hamstrings - Supine  Place strap around foot. Raise leg up, keeping knee straight.  Bend opposite knee to protect back if indicated. Hold 30 seconds. 3 reps per set, 2-3 sets per day  Calf Stretch    Place hands on wall at shoulder height. Keeping back leg straight, bend front leg, feet pointing forward, heels flat on floor. Lean forward slightly until stretch is felt in calf of back leg. Hold stretch ___ seconds, breathing slowly in and out. Repeat stretch with other leg back. Do ___ sessions per day. Variation: Use chair or table for support.  Achilles / Soleus, Standing    Stand, right foot behind, heel on floor and turned slightly out. Lower hips and bend knees. Hold _30__ seconds. Repeat _3__ times per session. Do _2-3__ sessions per day.   Quads / HF, Supine   Lie near edge of bed, pull both knees up toward chest. Hold one knee as you drop the other leg off the edge of the bed.  Relax hanging knee/can bend knee back if indicated. Hold 30 seconds. Repeat 3 times per session. Do 2-3 sessions per day.  Quads / HF, Prone KNEE: Quadriceps - Prone    Place strap around ankle. Bring ankle toward buttocks. Press hip into surface. Hold 30 seconds. Repeat 3 times per session. Do 2-3 sessions per day.

## 2017-12-31 NOTE — Therapy (Addendum)
[facility] [place] [address] [place], [place], [address] Phone: [phone]   Fax:  [phone]  Physical Therapy Evaluation  Patient Details  Name: Martin Kennedy MRN: [id number] Date of Birth: 1965-09-13 Referring Provider: Dr Benjamin Stain   Encounter Date: 12/31/2017  PT End of Session - 12/31/17 1008    Visit Number  1    Number of Visits  12    Date for PT Re-Evaluation  02/11/18    PT Start Time  0715    PT Stop Time  0805    PT Time Calculation (min)  50 min    Activity Tolerance  Patient tolerated treatment well       Past Medical History:  Diagnosis Date  . Asthma   . Hyperlipidemia     Past Surgical History:  Procedure Laterality Date  . BACK SURGERY      There were no vitals filed for this visit.   Subjective Assessment - 12/31/17 0728    Subjective  Patient reports noticing a nodule in the Lt Achilles tendon area about 5 minths ago with symptoms gradually increasing. He has used a walking boot some but it does not make a difference. He has had the boot for about 6 weeks but has not used in consistently.  he has had pain and tightness in the anterior hip for ~ 3-4 months     Pertinent History  lumbar discetomy 3-4 yrs ago with some continued pain; numbness in bilat LE's feet and "works its way up" Rt shoulder pain/dysfunction; cervical dysfunction with numbness into the arms     Patient Stated Goals  help get rid of the pain in the heel and be pain free    Currently in Pain?  Yes    Pain Score  3     Pain Location  Heel    Pain Orientation  Left    Pain Descriptors / Indicators  Stabbing    Pain Type  Chronic pain    Pain Onset  More than a month ago    Pain Frequency  Intermittent    Aggravating Factors   worse in the am when it is stiff; hiking; walking     Pain Relieving Factors  rest          [facility] PT Assessment - 12/31/17 0001      Assessment   Medical Diagnosis  Lt Achilees tendinitis;  Lt hip flexor tendinitis     Referring Provider  Dr Benjamin Stain    Onset Date/Surgical Date  07/07/17    Hand Dominance  Right    Next MD Visit  PRN    Prior Therapy  here for back and neck       Precautions   Precautions  None      Balance Screen   Has the patient fallen in the past 6 months  No    Has the patient had a decrease in activity level because of a fear of falling?   No    Is the patient reluctant to leave their home because of a fear of falling?   No      Prior Function   Level of Independence  Independent    Vocation  Full time employment    Vocation Requirements  desk/computer     Leisure  yard work; hiking; gym 3 tiems/wk when he can       Observation/Other Assessments   Focus on Therapeutic Outcomes (FOTO)   55% limitation  Sensation   Additional Comments  intermittent numbness in the Le's and UE's       Posture/Postural Control   Posture Comments  head forward; shoulders rounded; decreased lumbar lordosis;       AROM   Right/Left Hip  --   tight end ranges bilat    Right/Left Knee  --   WFL's bilat    Right Ankle Dorsiflexion  12    Left Ankle Dorsiflexion  4      Strength   Right/Left Hip  --   5/5 bilat    Right/Left Knee  --   5/5 bilat    Right/Left Ankle  --   5/5 bilat      Flexibility   Hamstrings  tight Rt 62; Lt 53    Quadriceps  tight bilat     ITB  tight bilat     Piriformis  tight Lt > Rt       Palpation   Palpation comment  tightness Lt Achilles; bilat hip flexors       Special Tests   Other special tests  hip flexor tightness bilat with Thomas test (-) 15-20 deg hip ext bilat                 Objective measurements completed on examination: See above findings.      [facility] Adult PT Treatment/Exercise - 12/31/17 0001      Knee/Hip Exercises: Stretches   Passive Hamstring Stretch  Right;Left;2 reps;30 seconds   supine with strap    Quad Stretch  Right;Left;2 reps;30 seconds   prone with strap    Hip  Flexor Stretch  Right;Left;2 reps;30 seconds   supine Thomas position and seated hip flexor stretch    Gastroc Stretch  Right;Left;2 reps;30 seconds    Soleus Stretch  Right;Left;2 reps;30 seconds             PT Education - 12/31/17 0801    Education Details  HEP     Person(s) Educated  Patient    Methods  Explanation;Demonstration;Tactile cues;Verbal cues;Handout    Comprehension  Verbalized understanding;Returned demonstration;Verbal cues required;Tactile cues required          PT Long Term Goals - 12/31/17 1016      PT LONG TERM GOAL #1   Title  Improve ROM Lt ankle DF to 10 deg 02/11/18    Time  6    Period  Weeks    Status  New      PT LONG TERM GOAL #2   Title  Decrease tightness and improve tissue extensibililty through bilat LE's with increased hip extension to neutral with Thomas test and HS flexibility to 75 deg bilat 02/11/18    Time  6    Period  Weeks    Status  New      PT LONG TERM GOAL #3   Title  Patient to report decrease in pain and tightness Lt LE by 50-75% allowing him to hike with minimal discomfort 02/11/18    Time  6    Period  Weeks    Status  New      PT LONG TERM GOAL #4   Title  Independent in HEP 02/11/18    Time  6    Period  Weeks    Status  New      PT LONG TERM GOAL #5   Title  Improve FOTO to </= 37% limitation 05/22/16    Time  6    Period  Weeks    Status  New             Plan - 12/31/17 1009    Clinical Impression Statement  Erez presents with 5-6 month history of Lt Achille's "pump" with varying degree of irritation of tightness. He also report tightness in the Lt > Rt hip flexors for about the same amount of time. He does not know of any accident or injury. He has played various sport over the years and states that he has not ever stretched. He feels that is part of his problem.. Patient has limited mobility and ROM bilat LE's and through the trunk; some tightness to palpation; subjective report of "tightness" more  than pain in the Achilles and hip flexors. He will benefit from PT to address problems identified.     History and Personal Factors relevant to plan of care:  Lumbar disc surgery ~ 3-4 yrs ago; cervical dysfunctionl Rt shoulder injury/problems; hx of lateral epicondylitis Rt     Clinical Presentation  Evolving    Clinical Decision Making  Low    Rehab Potential  Good    Clinical Impairments Affecting Rehab Potential  Patient does not want to come in to therapy to do exercises he can do at home. He is interested in deep tissue work. Not at all interested in DN. Knows he needs to stretch     PT Frequency  2x / week    PT Duration  6 weeks    PT Treatment/Interventions  Patient/family education;ADLs/Self Care Home Management;Cryotherapy;Electrical Stimulation;Iontophoresis 4mg /ml Dexamethasone;Moist Heat;Ultrasound;Manual techniques;Neuromuscular re-education;Therapeutic activities;Therapeutic exercise;Dry needling    PT Next Visit Plan  review HEP; add ball release work; add deep tissue work through the hips flexors/quads/calf; modalities as indicated     Consulted and Agree with Plan of Care  Patient       Patient will benefit from skilled therapeutic intervention in order to improve the following deficits and impairments:  Postural dysfunction, Improper body mechanics, Pain, Increased fascial restricitons, Increased muscle spasms, Decreased range of motion, Decreased mobility, Decreased activity tolerance  Visit Diagnosis: Pain of left heel - Plan: PT plan of care cert/re-cert  Other symptoms and signs involving the musculoskeletal system - Plan: PT plan of care cert/re-cert     Problem List Patient Active Problem List   Diagnosis Date Noted  . Hip flexor tendinitis, left 12/22/2017  . Achilles tendinitis, left leg 10/31/2017  . Annual physical exam 03/11/2017  . Generalized anxiety disorder 03/11/2017  . Lateral epicondylitis of right elbow 11/14/2015  . Obesity 02/03/2014  . Lumbar  degenerative disc disease 09/03/2013  . Osteoarthritis, hand 12/24/2012  . Cervical radiculitis 07/30/2012  . KERATOSIS PILARIS 01/29/2011  . ASTHMA 08/18/2010  . DERMATITIS, ATOPIC 08/18/2010  . HYPERLIPIDEMIA 07/21/2010    Yaa Donnellan Rober Minion PT, MPH  12/31/2017, 12:29 PM  [facility] [address] [place], [place], [address] Phone: [phone]   Fax:  [phone]  Name: Olaf Mesa MRN: [id number] Date of Birth: 03/02/1966

## 2018-01-05 ENCOUNTER — Ambulatory Visit (INDEPENDENT_AMBULATORY_CARE_PROVIDER_SITE_OTHER): Payer: BLUE CROSS/BLUE SHIELD | Admitting: Rehabilitative and Restorative Service Providers"

## 2018-01-05 ENCOUNTER — Encounter: Payer: Self-pay | Admitting: Rehabilitative and Restorative Service Providers"

## 2018-01-05 DIAGNOSIS — M7711 Lateral epicondylitis, right elbow: Secondary | ICD-10-CM | POA: Diagnosis not present

## 2018-01-05 DIAGNOSIS — R29898 Other symptoms and signs involving the musculoskeletal system: Secondary | ICD-10-CM | POA: Diagnosis not present

## 2018-01-05 DIAGNOSIS — M79672 Pain in left foot: Secondary | ICD-10-CM

## 2018-01-05 NOTE — Patient Instructions (Addendum)
Chair Sitting    Sit at edge of seat, spine straight, one leg extended. Put a hand on each thigh and bend forward from the hip, keeping spine straight. Allow hand on extended leg to reach toward toes. Support upper body with other arm. Hold _30-60__ seconds. Repeat _3__ times per session. Do _3-4__ sessions per day.

## 2018-01-05 NOTE — Therapy (Signed)
[facility] [place] [address] [place] [address] [place], [place], [address] Phone: [phone]   Fax:  [phone]  Physical Therapy Treatment  Patient Details  Name: Martin Kennedy MRN: [id number] Date of Birth: March 12, 1966 Referring Provider (PT): Dr Benjamin Stain   Encounter Date: 01/05/2018  PT End of Session - 01/05/18 0847    Visit Number  2    Number of Visits  12    Date for PT Re-Evaluation  02/11/18    PT Start Time  0845    PT Stop Time  0931    PT Time Calculation (min)  46 min    Activity Tolerance  Patient tolerated treatment well       Past Medical History:  Diagnosis Date  . Asthma   . Hyperlipidemia     Past Surgical History:  Procedure Laterality Date  . BACK SURGERY      There were no vitals filed for this visit.  Subjective Assessment - 01/05/18 0847    Subjective  Patient reports that he may be a little better than it has been. He has worked on his exercises but does not have a surface for the hip flexor stretch supine bed is too soft.     Currently in Pain?  Yes    Pain Score  2     Pain Location  Heel    Pain Orientation  Left    Pain Descriptors / Indicators  Tightness   stiffness    Pain Type  Chronic pain                       [facility] Adult PT Treatment/Exercise - 01/05/18 0001      Therapeutic Activites    Therapeutic Activities  --   instructed in myofacial ball release work      Knee/Hip Exercises: Community education officer  Right;Left;2 reps;30 seconds   supine with strap    Quad Stretch  Right;Left;2 reps;30 seconds   prone with strap    Hip Flexor Stretch  Right;Left;2 reps;30 seconds   supine Thomas position and seated hip flexor stretch    Piriformis Stretch  Right;Left;2 reps;30 seconds   in quadraped    Theme park manager  Right;Left;2 reps;30 seconds    Soleus Stretch  Right;Left;2 reps;30 seconds      Knee/Hip Exercises: Aerobic   Elliptical  L2 x 5 min        Manual Therapy   Manual therapy comments  pt supine and prone     Soft tissue mobilization  working through the hip flexors and proximal quads; gastroc/soleus (manually and IASTM; hamstrings              PT Education - 01/05/18 6962    Education Details  HEP    Person(s) Educated  Patient    Methods  Explanation;Demonstration;Tactile cues;Verbal cues;Handout    Comprehension  Verbalized understanding;Returned demonstration;Verbal cues required;Tactile cues required          PT Long Term Goals - 12/31/17 1016      PT LONG TERM GOAL #1   Title  Improve ROM Lt ankle DF to 10 deg 02/11/18    Time  6    Period  Weeks    Status  New      PT LONG TERM GOAL #2   Title  Decrease tightness and improve tissue extensibililty through bilat LE's with increased hip extension to neutral with Thomas test and HS flexibility to 75  deg bilat 02/11/18    Time  6    Period  Weeks    Status  New      PT LONG TERM GOAL #3   Title  Patient to report decrease in pain and tightness Lt LE by 50-75% allowing him to hike with minimal discomfort 02/11/18    Time  6    Period  Weeks    Status  New      PT LONG TERM GOAL #4   Title  Independent in HEP 02/11/18    Time  6    Period  Weeks    Status  New      PT LONG TERM GOAL #5   Title  Improve FOTO to </= 37% limitation 05/22/16    Time  6    Period  Weeks    Status  New            Plan - 01/05/18 0847    Clinical Impression Statement  Martin Kennedy reports that he has done the exercises he could remember. He is interested in the deep tissue work and not modalities. Continued to explain to Martin Kennedy that consistent exercise is important to make any changes in his tightness and pain. He has very limited mobility/tissue extensibility through Lt > Rt LE's. Tightness noted through the calf and hamstrings today more than the hip flexors/quads. Will focus on posterior chain at next visit.     Rehab Potential  Good    Clinical Impairments Affecting  Rehab Potential  Patient does not want to come in to therapy to do exercises he can do at home. He is interested in deep tissue work. Not at all interested in DN. Knows he needs to stretch     PT Frequency  2x / week    PT Duration  6 weeks    PT Treatment/Interventions  Patient/family education;ADLs/Self Care Home Management;Cryotherapy;Electrical Stimulation;Iontophoresis 4mg /ml Dexamethasone;Moist Heat;Ultrasound;Manual techniques;Neuromuscular re-education;Therapeutic activities;Therapeutic exercise;Dry needling    PT Next Visit Plan  review HEP; add ball release work; assess response deep tissue work through the hips flexors/quads/calf; modalities as indicated focus on calf and hamstrings - reinforce consistent stretching     Consulted and Agree with Plan of Care  Patient       Patient will benefit from skilled therapeutic intervention in order to improve the following deficits and impairments:  Postural dysfunction, Improper body mechanics, Pain, Increased fascial restricitons, Increased muscle spasms, Decreased range of motion, Decreased mobility, Decreased activity tolerance  Visit Diagnosis: Pain of left heel  Other symptoms and signs involving the musculoskeletal system  Lateral epicondylitis of right elbow     Problem List Patient Active Problem List   Diagnosis Date Noted  . Hip flexor tendinitis, left 12/22/2017  . Achilles tendinitis, left leg 10/31/2017  . Annual physical exam 03/11/2017  . Generalized anxiety disorder 03/11/2017  . Lateral epicondylitis of right elbow 11/14/2015  . Obesity 02/03/2014  . Lumbar degenerative disc disease 09/03/2013  . Osteoarthritis, hand 12/24/2012  . Cervical radiculitis 07/30/2012  . KERATOSIS PILARIS 01/29/2011  . ASTHMA 08/18/2010  . DERMATITIS, ATOPIC 08/18/2010  . HYPERLIPIDEMIA 07/21/2010    Veleria Barnhardt Rober Minion PT, MPH  01/05/2018, 9:53 AM  [facility] [address] [place] [address]  [address] [place], [place], [address] Phone: [phone]   Fax:  [phone]  Name: Martin Kennedy MRN: [id number] Date of Birth: 05-16-1965

## 2018-01-08 ENCOUNTER — Encounter: Payer: Self-pay | Admitting: Rehabilitative and Restorative Service Providers"

## 2018-01-08 ENCOUNTER — Ambulatory Visit (INDEPENDENT_AMBULATORY_CARE_PROVIDER_SITE_OTHER): Payer: BLUE CROSS/BLUE SHIELD | Admitting: Rehabilitative and Restorative Service Providers"

## 2018-01-08 DIAGNOSIS — R29898 Other symptoms and signs involving the musculoskeletal system: Secondary | ICD-10-CM | POA: Diagnosis not present

## 2018-01-08 DIAGNOSIS — M79672 Pain in left foot: Secondary | ICD-10-CM

## 2018-01-08 DIAGNOSIS — M7711 Lateral epicondylitis, right elbow: Secondary | ICD-10-CM

## 2018-01-08 NOTE — Therapy (Addendum)
[place], [place], [address] Phone: [phone]   Fax:  [phone]  Physical Therapy Treatment  Patient Details  Name: Martin Kennedy MRN: [id number] Date of Birth: Oct 13, 1965 Referring Provider (PT): Dr Dianah Field    Encounter Date: 01/08/2018  PT End of Session - 01/08/18 1619    Visit Number  3    Number of Visits  12    Date for PT Re-Evaluation  02/11/18    PT Start Time  1616    PT Stop Time  1704    PT Time Calculation (min)  48 min    Activity Tolerance  Patient tolerated treatment well       Past Medical History:  Diagnosis Date  . Asthma   . Hyperlipidemia     Past Surgical History:  Procedure Laterality Date  . BACK SURGERY      There were no vitals filed for this visit.  Subjective Assessment - 01/08/18 1619    Subjective  Patient reports that the nodule in Lt ankle is unchanged but the pain was a bit better following last treatment. Can tell his Lt LE is feeling better after today's treatment - less tightness. On vacation next week. Will be doing some hiking - not sure how much. Will be near [facility] [place]00 feet in elevation but may not try that hike.     Currently in Pain?  No/denies    Pain Location  Heel    Pain Orientation  Left    Pain Descriptors / Indicators  Tightness    Pain Type  Chronic pain    Pain Onset  More than a month ago    Pain Frequency  Intermittent         [facility] PT Assessment - 01/08/18 0001      Assessment   Medical Diagnosis  Lt Achilees tendinitis; Lt hip flexor tendinitis     Referring Provider (PT)  Dr Dianah Field     Onset Date/Surgical Date  07/07/17    Hand Dominance  Right    Next MD Visit  PRN    Prior Therapy  here for back and neck       Flexibility   Hamstrings  tight Rt 67 ;Lt 65 deg       Palpation   Palpation comment  tightness Lt Achilles/Lt hmastring especially laterally; bilat hip flexors       Special Tests    Other special tests  hip flexor tightness bilat with Marcello Moores test (-) 15-20 deg hip ext bilat                    [facility] Adult PT Treatment/Exercise - 01/08/18 0001      Knee/Hip Exercises: Stretches   Passive Hamstring Stretch  Right;Left;2 reps;30 seconds   supine with strap    Quad Stretch  Right;Left;2 reps;30 seconds   prone with strap    Hip Flexor Stretch  Right;Left;2 reps;30 seconds   supine Thomas position and seated hip flexor stretch    Piriformis Stretch  Right;Left;2 reps;30 seconds   in quadraped    Press photographer  Right;Left;2 reps;30 seconds    Soleus Stretch  Right;Left;2 reps;30 seconds      Manual Therapy   Manual therapy comments  pt prone     Soft tissue mobilization  working through the hamstrings; gastroc/soleus (manually and IASTM)  PT Long Term Goals - 12/31/17 1016      PT LONG TERM GOAL #1   Title  Improve ROM Lt ankle DF to 10 deg 02/11/18    Time  6    Period  Weeks    Status  New      PT LONG TERM GOAL #2   Title  Decrease tightness and improve tissue extensibililty through bilat LE's with increased hip extension to neutral with Thomas test and HS flexibility to 75 deg bilat 02/11/18    Time  6    Period  Weeks    Status  New      PT LONG TERM GOAL #3   Title  Patient to report decrease in pain and tightness Lt LE by 50-75% allowing him to hike with minimal discomfort 02/11/18    Time  6    Period  Weeks    Status  New      PT LONG TERM GOAL #4   Title  Independent in HEP 02/11/18    Time  6    Period  Weeks    Status  New      PT LONG TERM GOAL #5   Title  Improve FOTO to </= 37% limitation 05/22/16    Time  6    Period  Weeks    Status  New            Plan - 01/08/18 1619    Clinical Impression Statement  Some improvement in tightness with treatment. WOrking on stretching a bit more but not as much as he should. Gains in hamstring extensibility/ROM noted. Good response to deep tissue  work.     Rehab Potential  Good    Clinical Impairments Affecting Rehab Potential  Patient does not want to come in to therapy to do exercises he can do at home. He is interested in deep tissue work. Not at all interested in DN. Knows he needs to stretch     PT Frequency  2x / week    PT Duration  6 weeks    PT Treatment/Interventions  Patient/family education;ADLs/Self Care Home Management;Cryotherapy;Electrical Stimulation;Iontophoresis 15m/ml Dexamethasone;Moist Heat;Ultrasound;Manual techniques;Neuromuscular re-education;Therapeutic activities;Therapeutic exercise;Dry needling    PT Next Visit Plan  review HEP;  ball release work; continue deep tissue work through the hips flexors/quads/hamstringscalf; modalities as indicated focus on calf and hamstrings - reinforce consistent stretching     Consulted and Agree with Plan of Care  Patient       Patient will benefit from skilled therapeutic intervention in order to improve the following deficits and impairments:  Postural dysfunction, Improper body mechanics, Pain, Increased fascial restricitons, Increased muscle spasms, Decreased range of motion, Decreased mobility, Decreased activity tolerance  Visit Diagnosis: Pain of left heel  Other symptoms and signs involving the musculoskeletal system  Lateral epicondylitis of right elbow     Problem List Patient Active Problem List   Diagnosis Date Noted  . Hip flexor tendinitis, left 12/22/2017  . Achilles tendinitis, left leg 10/31/2017  . Annual physical exam 03/11/2017  . Generalized anxiety disorder 03/11/2017  . Lateral epicondylitis of right elbow 11/14/2015  . Obesity 02/03/2014  . Lumbar degenerative disc disease 09/03/2013  . Osteoarthritis, hand 12/24/2012  . Cervical radiculitis 07/30/2012  . KERATOSIS PILARIS 01/29/2011  . ASTHMA 08/18/2010  . DERMATITIS, ATOPIC 08/18/2010  . HYPERLIPIDEMIA 07/21/2010    Adi Doro PNilda SimmerPT, MPH  01/08/2018, 5:53 PM  [id number]  [place], [place], [address] Phone: [phone]   Fax:  [phone]  Name: Martin Kennedy MRN: [id number] Date of Birth: [birth year]   PHYSICAL THERAPY DISCHARGE SUMMARY  Visits from Start of Care: 3  Current functional level related to goals / functional outcomes: See progress notes for discharge status    Remaining deficits: Unknown    Education / Equipment: HEP  Plan: Patient agrees to discharge.  Patient goals were partially met. Patient is being discharged due to being pleased with the current functional level.  ?????    Chealsey Miyamoto P. Helene Kelp PT, MPH 02/26/18 1:58 PM

## 2018-01-19 ENCOUNTER — Encounter: Payer: BLUE CROSS/BLUE SHIELD | Admitting: Rehabilitative and Restorative Service Providers"

## 2018-01-21 ENCOUNTER — Encounter: Payer: BLUE CROSS/BLUE SHIELD | Admitting: Rehabilitative and Restorative Service Providers"

## 2018-01-22 ENCOUNTER — Encounter: Payer: Self-pay | Admitting: Sports Medicine

## 2018-01-22 ENCOUNTER — Ambulatory Visit (INDEPENDENT_AMBULATORY_CARE_PROVIDER_SITE_OTHER): Payer: BLUE CROSS/BLUE SHIELD | Admitting: Sports Medicine

## 2018-01-22 DIAGNOSIS — M7662 Achilles tendinitis, left leg: Secondary | ICD-10-CM | POA: Diagnosis not present

## 2018-01-22 NOTE — Assessment & Plan Note (Addendum)
50 mile hike is now finished. Ankle is feeling only slightly better. He will continue his eccentric rehabilitation. Still not using nitroglycerin patches, he looked up a study and felt there was no benefit.  I am pasting links for randomized controlled studies showing benefit of nitroglycerin patches over placebo for non-insertional Achilles tendinopathy.  [email or web address] [email or web address]

## 2018-01-22 NOTE — Progress Notes (Signed)
Subjective:    CC: Follow-up  HPI: Martin Kennedy returns, he just finished a 50 mile hike, overall doing okay.  I reviewed the past medical history, family history, social history, surgical history, and allergies today and no changes were needed.  Please see the problem list section below in epic for further details.  Past Medical History: Past Medical History:  Diagnosis Date  . Asthma   . Hyperlipidemia    Past Surgical History: Past Surgical History:  Procedure Laterality Date  . BACK SURGERY     Social History: Social History   Socioeconomic History  . Marital status: Married    Spouse name: Not on file  . Number of children: Not on file  . Years of education: Not on file  . Highest education level: Not on file  Occupational History  . Not on file  Social Needs  . Financial resource strain: Not on file  . Food insecurity:    Worry: Not on file    Inability: Not on file  . Transportation needs:    Medical: Not on file    Non-medical: Not on file  Tobacco Use  . Smoking status: Never Smoker  . Smokeless tobacco: Never Used  Substance and Sexual Activity  . Alcohol use: Yes  . Drug use: No  . Sexual activity: Not on file  Lifestyle  . Physical activity:    Days per week: Not on file    Minutes per session: Not on file  . Stress: Not on file  Relationships  . Social connections:    Talks on phone: Not on file    Gets together: Not on file    Attends religious service: Not on file    Active member of club or organization: Not on file    Attends meetings of clubs or organizations: Not on file    Relationship status: Not on file  Other Topics Concern  . Not on file  Social History Narrative  . Not on file   Family History: No family history on file. Allergies: Allergies  Allergen Reactions  . Erythromycin    Medications: See med rec.  Review of Systems: No fevers, chills, night sweats, weight loss, chest pain, or shortness of breath.   Objective:     General: Well Developed, well nourished, and in no acute distress.  Neuro: Alert and oriented x3, extra-ocular muscles intact, sensation grossly intact.  HEENT: Normocephalic, atraumatic, pupils equal round reactive to light, neck supple, no masses, no lymphadenopathy, thyroid nonpalpable.  Skin: Warm and dry, no rashes. Cardiac: Regular rate and rhythm, no murmurs rubs or gallops, no lower extremity edema.  Respiratory: Clear to auscultation bilaterally. Not using accessory muscles, speaking in full sentences. Left ankle: No visible erythema or swelling. Range of motion is full in all directions. Strength is 5/5 in all directions. Stable lateral and medial ligaments; squeeze test and kleiger test unremarkable; Talar dome nontender; No pain at base of 5th MT; No tenderness over cuboid; No tenderness over N spot or navicular prominence No tenderness on posterior aspects of lateral and medial malleolus No sign of peroneal tendon subluxations; Negative tarsal tunnel tinel's Able to walk 4 steps. Palpable minimally tender nodule in the mid Achilles.  Impression and Recommendations:    Achilles tendinitis, left leg 50 mile hike is now finished. Ankle is feeling only slightly better. He will continue his eccentric rehabilitation. Still not using nitroglycerin patches, he looked up a study and felt there was no benefit.  I am pasting  links for randomized controlled studies showing benefit of nitroglycerin patches over placebo for non-insertional Achilles tendinopathy.  [email or web address] [email or web address] [email or web address]  I spent 25 minutes with this patient, greater than 50% was face-to-face time counseling regarding the above diagnoses, specifically the data and studies behind the use of topical nitroglycerin for non-insertional Achilles  tendinopathy. ___________________________________________ Ihor Austin. Benjamin Stain, M.D., ABFM., CAQSM. Primary Care and Sports Medicine [facility]  Adjunct Professor of Family Medicine  [employer]

## 2018-01-23 ENCOUNTER — Encounter: Payer: BLUE CROSS/BLUE SHIELD | Admitting: Rehabilitative and Restorative Service Providers"

## 2018-04-29 DIAGNOSIS — M545 Low back pain: Secondary | ICD-10-CM | POA: Diagnosis not present

## 2018-04-29 DIAGNOSIS — M48061 Spinal stenosis, lumbar region without neurogenic claudication: Secondary | ICD-10-CM | POA: Diagnosis not present

## 2018-04-29 DIAGNOSIS — M4307 Spondylolysis, lumbosacral region: Secondary | ICD-10-CM | POA: Diagnosis not present

## 2018-05-06 DIAGNOSIS — M545 Low back pain: Secondary | ICD-10-CM | POA: Diagnosis not present

## 2018-05-15 DIAGNOSIS — M4307 Spondylolysis, lumbosacral region: Secondary | ICD-10-CM | POA: Diagnosis not present

## 2018-05-15 DIAGNOSIS — M5416 Radiculopathy, lumbar region: Secondary | ICD-10-CM | POA: Diagnosis not present

## 2018-10-07 DIAGNOSIS — D171 Benign lipomatous neoplasm of skin and subcutaneous tissue of trunk: Secondary | ICD-10-CM | POA: Diagnosis not present

## 2018-10-07 DIAGNOSIS — L2089 Other atopic dermatitis: Secondary | ICD-10-CM | POA: Diagnosis not present

## 2019-11-15 DIAGNOSIS — D171 Benign lipomatous neoplasm of skin and subcutaneous tissue of trunk: Secondary | ICD-10-CM | POA: Diagnosis not present

## 2019-11-15 DIAGNOSIS — L821 Other seborrheic keratosis: Secondary | ICD-10-CM | POA: Diagnosis not present

## 2019-11-15 DIAGNOSIS — D485 Neoplasm of uncertain behavior of skin: Secondary | ICD-10-CM | POA: Diagnosis not present

## 2019-11-15 DIAGNOSIS — L7211 Pilar cyst: Secondary | ICD-10-CM | POA: Diagnosis not present

## 2019-11-25 DIAGNOSIS — M7989 Other specified soft tissue disorders: Secondary | ICD-10-CM | POA: Diagnosis not present

## 2020-02-09 DIAGNOSIS — L729 Follicular cyst of the skin and subcutaneous tissue, unspecified: Secondary | ICD-10-CM | POA: Diagnosis not present

## 2020-02-09 DIAGNOSIS — D485 Neoplasm of uncertain behavior of skin: Secondary | ICD-10-CM | POA: Diagnosis not present

## 2020-02-10 ENCOUNTER — Ambulatory Visit (INDEPENDENT_AMBULATORY_CARE_PROVIDER_SITE_OTHER): Payer: BC Managed Care – PPO

## 2020-02-10 ENCOUNTER — Other Ambulatory Visit: Payer: Self-pay

## 2020-02-10 ENCOUNTER — Ambulatory Visit (INDEPENDENT_AMBULATORY_CARE_PROVIDER_SITE_OTHER): Payer: BC Managed Care – PPO | Admitting: Sports Medicine

## 2020-02-10 DIAGNOSIS — M1612 Unilateral primary osteoarthritis, left hip: Secondary | ICD-10-CM

## 2020-02-10 DIAGNOSIS — R1032 Left lower quadrant pain: Secondary | ICD-10-CM | POA: Diagnosis not present

## 2020-02-10 DIAGNOSIS — R102 Pelvic and perineal pain: Secondary | ICD-10-CM

## 2020-02-10 MED ORDER — MELOXICAM 15 MG PO TABS: ORAL_TABLET | ORAL | 3 refills | Status: DC

## 2020-02-10 NOTE — Assessment & Plan Note (Signed)
Martin Kennedy is started to have pain in his left groin, worse with hiking, he does have a history of hip flexor tendinitis in the past. Today he has pain with internal rotation. This is consistent with hip OA. X-rays, meloxicam, hip conditioning exercises. He will see me at least a week before he goes hiking again in [address] and we can certainly try an injection if needed.

## 2020-02-10 NOTE — Progress Notes (Signed)
    Procedures performed today:    None.  Independent interpretation of notes and tests performed by another provider:   None.  Brief History, Exam, Impression, and Recommendations:    Primary osteoarthritis of left hip [place] is started to have pain in his left groin, worse with hiking, he does have a history of hip flexor tendinitis in the past. Today he has pain with internal rotation. This is consistent with hip OA. X-rays, meloxicam, hip conditioning exercises. He will see me at least a week before he goes hiking again in [address] and we can certainly try an injection if needed.    ___________________________________________ Ihor Austin. Benjamin Stain, M.D., ABFM., CAQSM. Primary Care and Sports Medicine [facility]  Adjunct Instructor of Family Medicine  [employer]

## 2020-02-10 NOTE — Addendum Note (Signed)
Addended by: Monica Becton on: 02/10/2020 09:36 AM   Modules accepted: Orders

## 2020-02-16 DIAGNOSIS — D171 Benign lipomatous neoplasm of skin and subcutaneous tissue of trunk: Secondary | ICD-10-CM | POA: Diagnosis not present

## 2020-02-16 DIAGNOSIS — M7989 Other specified soft tissue disorders: Secondary | ICD-10-CM | POA: Diagnosis not present

## 2020-02-23 DIAGNOSIS — L729 Follicular cyst of the skin and subcutaneous tissue, unspecified: Secondary | ICD-10-CM | POA: Diagnosis not present

## 2020-02-23 DIAGNOSIS — D485 Neoplasm of uncertain behavior of skin: Secondary | ICD-10-CM | POA: Diagnosis not present

## 2020-05-10 ENCOUNTER — Ambulatory Visit: Payer: BC Managed Care – PPO | Admitting: Sports Medicine

## 2020-06-09 ENCOUNTER — Ambulatory Visit: Payer: BC Managed Care – PPO | Admitting: Sports Medicine

## 2020-06-27 DIAGNOSIS — M7989 Other specified soft tissue disorders: Secondary | ICD-10-CM | POA: Diagnosis not present

## 2020-06-28 DIAGNOSIS — M7989 Other specified soft tissue disorders: Secondary | ICD-10-CM | POA: Insufficient documentation

## 2020-06-28 MED ORDER — INDOMETHACIN 50 MG PO CAPS: 50.0000 mg | ORAL_CAPSULE | Freq: Two times a day (BID) | ORAL | 11 refills | Status: AC

## 2020-06-28 MED ORDER — INDOMETHACIN 50 MG PO CAPS: 50.0000 mg | ORAL_CAPSULE | Freq: Two times a day (BID) | ORAL | 11 refills | Status: DC

## 2020-06-28 MED ORDER — COLCHICINE 0.6 MG PO TABS: 0.6000 mg | ORAL_TABLET | Freq: Two times a day (BID) | ORAL | 3 refills | Status: DC

## 2020-06-28 NOTE — Assessment & Plan Note (Signed)
Question podagra per patient report, advised to come in at some point for a visit, sending in indomethacin and colchicine for a week.  I spent 5 total minutes of online digital evaluation and management services.

## 2020-06-28 NOTE — Addendum Note (Signed)
Addended by: Monica Becton on: 06/28/2020 01:13 PM   Modules accepted: Orders

## 2020-06-30 ENCOUNTER — Telehealth: Payer: Self-pay

## 2020-06-30 NOTE — Telephone Encounter (Signed)
He only needs the indomethacin for gout flares, and he can stop the meloxicam on days he takes indomethacin.

## 2020-06-30 NOTE — Telephone Encounter (Signed)
Liborio Nixon with [employer] called to clarify if the indomethacin replaces the meloxicam that they have on file. Please advise.

## 2020-07-03 NOTE — Telephone Encounter (Signed)
Spoke with [employer], they are aware that patient is to alternate indomethacin and meloxicam for gout flares.   Left message for patient to return call to office. Wanted to reiterate to patient this information as well.

## 2020-07-06 NOTE — Telephone Encounter (Signed)
Spoke with patient to confirm that he understood not to take both medications together.

## 2021-01-19 ENCOUNTER — Other Ambulatory Visit: Payer: Self-pay | Admitting: Sports Medicine

## 2021-01-19 DIAGNOSIS — M1612 Unilateral primary osteoarthritis, left hip: Secondary | ICD-10-CM

## 2021-03-28 DIAGNOSIS — Z20822 Contact with and (suspected) exposure to covid-19: Secondary | ICD-10-CM | POA: Diagnosis not present

## 2021-03-28 DIAGNOSIS — Z03818 Encounter for observation for suspected exposure to other biological agents ruled out: Secondary | ICD-10-CM | POA: Diagnosis not present

## 2021-05-09 ENCOUNTER — Other Ambulatory Visit: Payer: Self-pay

## 2021-05-09 ENCOUNTER — Ambulatory Visit (INDEPENDENT_AMBULATORY_CARE_PROVIDER_SITE_OTHER): Payer: BC Managed Care – PPO | Admitting: Sports Medicine

## 2021-05-09 VITALS — BP 128/85 | HR 65 | Wt 246.0 lb

## 2021-05-09 DIAGNOSIS — E6609 Other obesity due to excess calories: Secondary | ICD-10-CM

## 2021-05-09 DIAGNOSIS — N139 Obstructive and reflux uropathy, unspecified: Secondary | ICD-10-CM | POA: Diagnosis not present

## 2021-05-09 DIAGNOSIS — Z23 Encounter for immunization: Secondary | ICD-10-CM | POA: Diagnosis not present

## 2021-05-09 DIAGNOSIS — Z0000 Encounter for general adult medical examination without abnormal findings: Secondary | ICD-10-CM

## 2021-05-09 DIAGNOSIS — E782 Mixed hyperlipidemia: Secondary | ICD-10-CM | POA: Diagnosis not present

## 2021-05-09 MED ORDER — PHENTERMINE HCL 37.5 MG PO TABS: ORAL_TABLET | ORAL | 0 refills | Status: DC

## 2021-05-09 NOTE — Addendum Note (Signed)
Addended by: Monica Becton on: 05/09/2021 10:25 AM   Modules accepted: Orders

## 2021-05-09 NOTE — Addendum Note (Signed)
Addended by: Gust Brooms on: 05/09/2021 10:20 AM   Modules accepted: Orders

## 2021-05-09 NOTE — Progress Notes (Addendum)
Subjective:    CC: Annual Physical Exam  HPI:  This patient is here for their annual physical  I reviewed the past medical history, family history, social history, surgical history, and allergies today and no changes were needed.  Please see the problem list section below in epic for further details.  Past Medical History: Past Medical History:  Diagnosis Date   Asthma    Hyperlipidemia    Past Surgical History: Past Surgical History:  Procedure Laterality Date   BACK SURGERY     Social History: Social History   Socioeconomic History   Marital status: Married    Spouse name: Not on file   Number of children: Not on file   Years of education: Not on file   Highest education level: Not on file  Occupational History   Not on file  Tobacco Use   Smoking status: Never   Smokeless tobacco: Never  Substance and Sexual Activity   Alcohol use: Yes   Drug use: No   Sexual activity: Not on file  Other Topics Concern   Not on file  Social History Narrative   Not on file   Social Determinants of Health   Financial Resource Strain: Not on file  Food Insecurity: Not on file  Transportation Needs: Not on file  Physical Activity: Not on file  Stress: Not on file  Social Connections: Not on file   Family History: No family history on file. Allergies: Allergies  Allergen Reactions   Erythromycin    Medications: See med rec.  Review of Systems: No headache, visual changes, nausea, vomiting, diarrhea, constipation, dizziness, abdominal pain, skin rash, fevers, chills, night sweats, swollen lymph nodes, weight loss, chest pain, body aches, joint swelling, muscle aches, shortness of breath, mood changes, visual or auditory hallucinations.  Objective:    General: Well Developed, well nourished, and in no acute distress.  Neuro: Alert and oriented x3, extra-ocular muscles intact, sensation grossly intact. Cranial nerves II through XII are intact, motor, sensory, and  coordinative functions are all intact. HEENT: Normocephalic, atraumatic, pupils equal round reactive to light, neck supple, no masses, no lymphadenopathy, thyroid nonpalpable. Oropharynx, nasopharynx, external ear canals are unremarkable. Skin: Warm and dry, no rashes noted.  Cardiac: Regular rate and rhythm, no murmurs rubs or gallops.  Respiratory: Clear to auscultation bilaterally. Not using accessory muscles, speaking in full sentences.  Abdominal: Soft, nontender, nondistended, positive bowel sounds, no masses, no organomegaly.  Musculoskeletal: Shoulder, elbow, wrist, hip, knee, ankle stable, and with full range of motion.  Impression and Recommendations:    The patient was counselled, risk factors were discussed, anticipatory guidance given.  Annual physical exam Annual physical as above, he does prefer colonoscopy, placing referral now, Tdap today, declines influenza and Shingrix vaccines.  Obesity Hatcher is also interested in doing some additional weight loss, we did phentermine in the past that worked pretty well but unfortunately the weight came back, I did mention Wegovy, Saxenda and the other GLP-1's. He will do some Mediterranean dieting on his own and let me know if he would like me to call in [facility]. For insurance coverage purposes he has a BMI over 30, he will be enrolled in a multidisciplinary weight loss program including diet, exercise, everything will be physician supervised.  In addition to the physical we treated her chronic process with exacerbation with pharmacologic intervention.  ___________________________________________ Ihor Austin. Benjamin Stain, M.D., ABFM., CAQSM. Primary Care and Sports Medicine [facility]  Adjunct Professor of [facility] [employer]  [employer]

## 2021-05-09 NOTE — Assessment & Plan Note (Signed)
Annual physical as above, he does prefer colonoscopy, placing referral now, Tdap today, declines influenza and Shingrix vaccines.

## 2021-05-09 NOTE — Assessment & Plan Note (Addendum)
Martin Kennedy is also interested in doing some additional weight loss, we did phentermine in the past that worked pretty well but unfortunately the weight came back, I did mention Wegovy, Saxenda and the other GLP-1's. He will do some Mediterranean dieting on his own and let me know if he would like me to call in [facility]. For insurance coverage purposes he has a BMI over 30, he will be enrolled in a multidisciplinary weight loss program including diet, exercise, everything will be physician supervised.  At the end of the visit he did asked me to add some phentermine, I will call in 2 months and we can recheck afterwards.

## 2021-05-10 ENCOUNTER — Encounter: Payer: Self-pay | Admitting: Sports Medicine

## 2021-05-10 LAB — CBC
HCT: 46.8 % (ref 38.5–50.0)
Hemoglobin: 16.1 g/dL (ref 13.2–17.1)
MCH: 30.8 pg (ref 27.0–33.0)
MCHC: 34.4 g/dL (ref 32.0–36.0)
MCV: 89.5 fL (ref 80.0–100.0)
MPV: 10.2 fL (ref 7.5–12.5)
Platelets: 233 10*3/uL (ref 140–400)
RBC: 5.23 10*6/uL (ref 4.20–5.80)
RDW: 13.7 % (ref 11.0–15.0)
WBC: 5.6 10*3/uL (ref 3.8–10.8)

## 2021-05-10 LAB — COMPREHENSIVE METABOLIC PANEL
AG Ratio: 1.8 (calc) (ref 1.0–2.5)
ALT: 44 U/L (ref 9–46)
AST: 22 U/L (ref 10–35)
Albumin: 4.8 g/dL (ref 3.6–5.1)
Alkaline phosphatase (APISO): 55 U/L (ref 35–144)
BUN: 19 mg/dL (ref 7–25)
CO2: 28 mmol/L (ref 20–32)
Calcium: 9.6 mg/dL (ref 8.6–10.3)
Chloride: 104 mmol/L (ref 98–110)
Creat: 0.99 mg/dL (ref 0.70–1.30)
Globulin: 2.6 g/dL (calc) (ref 1.9–3.7)
Glucose, Bld: 106 mg/dL — ABNORMAL HIGH (ref 65–99)
Potassium: 4.8 mmol/L (ref 3.5–5.3)
Sodium: 140 mmol/L (ref 135–146)
Total Bilirubin: 0.6 mg/dL (ref 0.2–1.2)
Total Protein: 7.4 g/dL (ref 6.1–8.1)

## 2021-05-10 LAB — LIPID PANEL
Cholesterol: 181 mg/dL (ref <200)
HDL: 32 mg/dL — ABNORMAL LOW (ref >=40)
LDL Cholesterol (Calc): 127 mg/dL (calc) — ABNORMAL HIGH
Non-HDL Cholesterol (Calc): 149 mg/dL (calc) — ABNORMAL HIGH (ref <130)
Total CHOL/HDL Ratio: 5.7 (calc) — ABNORMAL HIGH (ref <5.0)
Triglycerides: 108 mg/dL (ref <150)

## 2021-05-10 LAB — TSH: TSH: 2.44 mIU/L (ref 0.40–4.50)

## 2021-05-10 LAB — PSA, TOTAL AND FREE
PSA, % Free: 31 % (calc) (ref >25)
PSA, Free: 0.4 ng/mL
PSA, Total: 1.3 ng/mL (ref <=4.0)

## 2021-05-10 LAB — HEMOGLOBIN A1C
Hgb A1c MFr Bld: 5.8 % of total Hgb — ABNORMAL HIGH (ref <5.7)
Mean Plasma Glucose: 120 mg/dL
eAG (mmol/L): 6.6 mmol/L

## 2021-05-17 DIAGNOSIS — M5459 Other low back pain: Secondary | ICD-10-CM | POA: Diagnosis not present

## 2021-06-06 ENCOUNTER — Other Ambulatory Visit: Payer: Self-pay

## 2021-06-06 ENCOUNTER — Ambulatory Visit (INDEPENDENT_AMBULATORY_CARE_PROVIDER_SITE_OTHER): Payer: BC Managed Care – PPO | Admitting: Sports Medicine

## 2021-06-06 DIAGNOSIS — E6609 Other obesity due to excess calories: Secondary | ICD-10-CM | POA: Diagnosis not present

## 2021-06-06 DIAGNOSIS — S63091A Other subluxation of right wrist and hand, initial encounter: Secondary | ICD-10-CM | POA: Insufficient documentation

## 2021-06-06 NOTE — Assessment & Plan Note (Signed)
Martin Kennedy lost a great deal of weight, he does desire to taper off of phentermine, we will just not prescribe any more. ?

## 2021-06-06 NOTE — Progress Notes (Signed)
? ? ?  Procedures performed today:   ? ?None. ? ?Independent interpretation of notes and tests performed by another provider:  ? ?None. ? ?Brief History, Exam, Impression, and Recommendations:   ? ?Obesity ?Martin Kennedy lost a great deal of weight, he does desire to taper off of phentermine, we will just not prescribe any more. ? 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 ? ? ?___________________________________________ ?Ihor Austin. Benjamin Stain, M.D., ABFM., CAQSM. ?Primary Care and Sports Medicine ? [facility] ? ?Adjunct Instructor of Family Medicine  ?[employer] ?

## 2021-06-06 NOTE — Assessment & Plan Note (Signed)
Right ulnar-sided wrist pain for some time now, worse with gripping motions, pain along the extensor carpi ulnaris, we have added extensor carpi ulnaris conditioning, he would like to hold off on any kind of imaging for now, they are moving back to [place] ?

## 2021-12-02 ENCOUNTER — Other Ambulatory Visit: Payer: Self-pay | Admitting: Sports Medicine

## 2021-12-02 DIAGNOSIS — M7989 Other specified soft tissue disorders: Secondary | ICD-10-CM

## 2021-12-03 ENCOUNTER — Encounter: Payer: Self-pay | Admitting: Sports Medicine

## 2021-12-07 ENCOUNTER — Encounter: Payer: Self-pay | Admitting: Sports Medicine

## 2021-12-07 ENCOUNTER — Telehealth (INDEPENDENT_AMBULATORY_CARE_PROVIDER_SITE_OTHER): Payer: BC Managed Care – PPO | Admitting: Sports Medicine

## 2021-12-07 DIAGNOSIS — M10071 Idiopathic gout, right ankle and foot: Secondary | ICD-10-CM | POA: Diagnosis not present

## 2021-12-07 DIAGNOSIS — M109 Gout, unspecified: Secondary | ICD-10-CM | POA: Insufficient documentation

## 2021-12-07 MED ORDER — PREDNISONE 50 MG PO TABS: ORAL_TABLET | ORAL | 0 refills | Status: DC

## 2021-12-07 MED ORDER — COLCHICINE 0.6 MG PO TABS: ORAL_TABLET | ORAL | 11 refills | Status: DC

## 2021-12-07 NOTE — Assessment & Plan Note (Signed)
Pleasant 56 year old male, he has a history of gout/podagra, never confirmed with uric acid levels or arthrocentesis, recently he has had increasing pain and swelling right posterior heel at the Achilles insertion with some redness. This reached a crescendo over 24 to 36 hours. He did endorse eating a large amount of meat prior. On the video he does look to have a fairly erythematous and swollen retrocalcaneal bursa. We will treat him as if this is a gout flare though insertional Achilles tendinosis can present similarly albeit as not as inflamed. Adding colchicine, prednisone. Home conditioning. We can revisit this in a month or so and switch to insertional Achilles rehab with eccentric conditioning and nitroglycerin if persistent discomfort. In the future when he does get his routine labs we do need to check uric acid levels.

## 2021-12-07 NOTE — Progress Notes (Signed)
   Virtual Visit via WebEx/MyChart   I connected with  Martin Kennedy  on 12/07/21 via WebEx/MyChart/Doximity Video and verified that I am speaking with the correct person using two identifiers.   I discussed the limitations, risks, security and privacy concerns of performing an evaluation and management service by WebEx/MyChart/Doximity Video, including the higher likelihood of inaccurate diagnosis and treatment, and the availability of in person appointments.  We also discussed the likely need of an additional face to face encounter for complete and high quality delivery of care.  I also discussed with the patient that there may be a patient responsible charge related to this service. The patient expressed understanding and wishes to proceed.  Provider location is in medical facility. Patient location is at their home, different from provider location. People involved in care of the patient during this telehealth encounter were myself, my nurse/medical assistant, and my front office/scheduling team member.  Review of Systems: No fevers, chills, night sweats, weight loss, chest pain, or shortness of breath.   Objective Findings:    General: Speaking full sentences, no audible heavy breathing.  Sounds alert and appropriately interactive.  Appears well.  Face symmetric.  Extraocular movements intact.  Pupils equal and round.  No nasal flaring or accessory muscle use visualized.  Independent interpretation of tests performed by another provider:   None.  Brief History, Exam, Impression, and Recommendations:    Gout of right retrocalcaneal bursa Pleasant 56 year old male, he has a history of gout/podagra, never confirmed with uric acid levels or arthrocentesis, recently he has had increasing pain and swelling right posterior heel at the Achilles insertion with some redness. This reached a crescendo over 24 to 36 hours. He did endorse eating a large amount of meat prior. On the video he does  look to have a fairly erythematous and swollen retrocalcaneal bursa. We will treat him as if this is a gout flare though insertional Achilles tendinosis can present similarly albeit as not as inflamed. Adding colchicine, prednisone. Home conditioning. We can revisit this in a month or so and switch to insertional Achilles rehab with eccentric conditioning and nitroglycerin if persistent discomfort. In the future when he does get his routine labs we do need to check uric acid levels.   I discussed the above assessment and treatment plan with the patient. The patient was provided an opportunity to ask questions and all were answered. The patient agreed with the plan and demonstrated an understanding of the instructions.   The patient was advised to call back or seek an in-person evaluation if the symptoms worsen or if the condition fails to improve as anticipated.   I provided 30 minutes of face to face and non-face-to-face time during this encounter date, time was needed to gather information, review chart, records, communicate/coordinate with staff remotely, as well as complete documentation.   ____________________________________________ Ihor Austin. Benjamin Stain, M.D., ABFM., CAQSM., AME. Primary Care and Sports Medicine [facility]  Adjunct Professor of Family Medicine  [place] [employer]  Restaurant manager, fast food

## 2021-12-11 ENCOUNTER — Telehealth: Payer: BC Managed Care – PPO | Admitting: Sports Medicine

## 2021-12-29 ENCOUNTER — Other Ambulatory Visit: Payer: Self-pay | Admitting: Sports Medicine

## 2021-12-29 DIAGNOSIS — M1612 Unilateral primary osteoarthritis, left hip: Secondary | ICD-10-CM

## 2022-02-12 ENCOUNTER — Other Ambulatory Visit: Payer: Self-pay | Admitting: Sports Medicine

## 2022-02-12 DIAGNOSIS — M10071 Idiopathic gout, right ankle and foot: Secondary | ICD-10-CM

## 2022-02-15 DIAGNOSIS — M79641 Pain in right hand: Secondary | ICD-10-CM | POA: Diagnosis not present

## 2022-05-06 DIAGNOSIS — M542 Cervicalgia: Secondary | ICD-10-CM | POA: Diagnosis not present

## 2022-05-06 DIAGNOSIS — M5412 Radiculopathy, cervical region: Secondary | ICD-10-CM | POA: Diagnosis not present

## 2022-05-06 DIAGNOSIS — M5416 Radiculopathy, lumbar region: Secondary | ICD-10-CM | POA: Diagnosis not present

## 2022-05-25 ENCOUNTER — Encounter: Payer: Self-pay | Admitting: Sports Medicine

## 2022-05-29 ENCOUNTER — Telehealth (INDEPENDENT_AMBULATORY_CARE_PROVIDER_SITE_OTHER): Payer: BC Managed Care – PPO | Admitting: Sports Medicine

## 2022-05-29 DIAGNOSIS — N139 Obstructive and reflux uropathy, unspecified: Secondary | ICD-10-CM

## 2022-05-29 DIAGNOSIS — R200 Anesthesia of skin: Secondary | ICD-10-CM

## 2022-05-29 DIAGNOSIS — R7303 Prediabetes: Secondary | ICD-10-CM | POA: Diagnosis not present

## 2022-05-29 NOTE — Assessment & Plan Note (Signed)
Martin Kennedy is a pleasant 57 year old male, he does have known cervical and lumbar degenerative disc disease, he is also prediabetic with an A1c of 5.8%. He has noted increasing paresthesias, numbness right hand, left hand, seemingly more in a C6 versus C7 distribution. He also has paresthesias in the dorsum of his right foot particular with palpation over the fibula midshaft. We did discuss that there could be multiple potential etiologies including radiculopathy, peroneal entrapment, diabetic peripheral neuropathy. He does have an appointment with orthopedic surgery, it sounds like they are going to be getting advanced imaging of his neck. I do think he needs a nerve conduction/EMG, as this was quite painful for him in the past I am happy to add some hydrocodone prior to the test if it gets to that. He will let me know. In the meantime further management per his orthopedic surgeon.

## 2022-05-29 NOTE — Progress Notes (Signed)
   Virtual Visit via WebEx/MyChart   I connected with  Martin Kennedy  on 05/29/22 via WebEx/MyChart/Doximity Video and verified that I am speaking with the correct person using two identifiers.   I discussed the limitations, risks, security and privacy concerns of performing an evaluation and management service by WebEx/MyChart/Doximity Video, including the higher likelihood of inaccurate diagnosis and treatment, and the availability of in person appointments.  We also discussed the likely need of an additional face to face encounter for complete and high quality delivery of care.  I also discussed with the patient that there may be a patient responsible charge related to this service. The patient expressed understanding and wishes to proceed.  Provider location is in medical facility. Patient location is at their home, different from provider location. People involved in care of the patient during this telehealth encounter were myself, my nurse/medical assistant, and my front office/scheduling team member.  Review of Systems: No fevers, chills, night sweats, weight loss, chest pain, or shortness of breath.   Objective Findings:    General: Speaking full sentences, no audible heavy breathing.  Sounds alert and appropriately interactive.  Appears well.  Face symmetric.  Extraocular movements intact.  Pupils equal and round.  No nasal flaring or accessory muscle use visualized.  Independent interpretation of tests performed by another provider:   None.  Brief History, Exam, Impression, and Recommendations:    Numbness hands and right leg/foot Martin Kennedy is a pleasant 56 year old male, he does have known cervical and lumbar degenerative disc disease, he is also prediabetic with an A1c of 5.8%. He has noted increasing paresthesias, numbness right hand, left hand, seemingly more in a C6 versus C7 distribution. He also has paresthesias in the dorsum of his right foot particular with palpation over  the fibula midshaft. We did discuss that there could be multiple potential etiologies including radiculopathy, peroneal entrapment, diabetic peripheral neuropathy. He does have an appointment with orthopedic surgery, it sounds like they are going to be getting advanced imaging of his neck. I do think he needs a nerve conduction/EMG, as this was quite painful for him in the past I am happy to add some hydrocodone prior to the test if it gets to that. He will let me know. In the meantime further management per his orthopedic surgeon.  Prediabetes Checking routine labs, he we will fax his lab orders to [facility] in [facility].   I discussed the above assessment and treatment plan with the patient. The patient was provided an opportunity to ask questions and all were answered. The patient agreed with the plan and demonstrated an understanding of the instructions.   The patient was advised to call back or seek an in-person evaluation if the symptoms worsen or if the condition fails to improve as anticipated.   I provided 30 minutes of face to face and non-face-to-face time during this encounter date, time was needed to gather information, review chart, records, communicate/coordinate with staff remotely, as well as complete documentation.   ____________________________________________ Gwen Her. Dianah Field, M.D., ABFM., CAQSM., AME. Primary Care and Sports Medicine [facility]  Adjunct Professor of [employer] [facility] [employer]  Risk manager

## 2022-05-29 NOTE — Assessment & Plan Note (Signed)
Checking routine labs, he we will fax his lab orders to [facility] in [facility].

## 2022-06-04 DIAGNOSIS — M5416 Radiculopathy, lumbar region: Secondary | ICD-10-CM | POA: Diagnosis not present

## 2022-06-04 DIAGNOSIS — M5412 Radiculopathy, cervical region: Secondary | ICD-10-CM | POA: Diagnosis not present

## 2022-06-11 DIAGNOSIS — R7303 Prediabetes: Secondary | ICD-10-CM | POA: Diagnosis not present

## 2022-06-11 DIAGNOSIS — N139 Obstructive and reflux uropathy, unspecified: Secondary | ICD-10-CM | POA: Diagnosis not present

## 2022-06-11 DIAGNOSIS — Z79899 Other long term (current) drug therapy: Secondary | ICD-10-CM | POA: Diagnosis not present

## 2022-06-11 DIAGNOSIS — R200 Anesthesia of skin: Secondary | ICD-10-CM | POA: Diagnosis not present

## 2022-06-12 ENCOUNTER — Encounter: Payer: Self-pay | Admitting: Sports Medicine

## 2022-06-12 DIAGNOSIS — M5412 Radiculopathy, cervical region: Secondary | ICD-10-CM | POA: Diagnosis not present

## 2022-06-13 LAB — CBC
HCT: 44 % (ref 38.5–50.0)
Hemoglobin: 15.1 g/dL (ref 13.2–17.1)
MCH: 30.4 pg (ref 27.0–33.0)
MCHC: 34.3 g/dL (ref 32.0–36.0)
MCV: 88.5 fL (ref 80.0–100.0)
MPV: 10.4 fL (ref 7.5–12.5)
Platelets: 238 10*3/uL (ref 140–400)
RBC: 4.97 10*6/uL (ref 4.20–5.80)
RDW: 13.2 % (ref 11.0–15.0)
WBC: 7.5 10*3/uL (ref 3.8–10.8)

## 2022-06-13 LAB — HEMOGLOBIN A1C
Hgb A1c MFr Bld: 5.8 % of total Hgb — ABNORMAL HIGH (ref <5.7)
Mean Plasma Glucose: 120 mg/dL
eAG (mmol/L): 6.6 mmol/L

## 2022-06-13 LAB — COMPLETE METABOLIC PANEL WITH GFR
AG Ratio: 1.9 (calc) (ref 1.0–2.5)
ALT: 19 U/L (ref 9–46)
AST: 15 U/L (ref 10–35)
Albumin: 4.9 g/dL (ref 3.6–5.1)
Alkaline phosphatase (APISO): 58 U/L (ref 35–144)
BUN: 22 mg/dL (ref 7–25)
CO2: 24 mmol/L (ref 20–32)
Calcium: 9.9 mg/dL (ref 8.6–10.3)
Chloride: 105 mmol/L (ref 98–110)
Creat: 1.02 mg/dL (ref 0.70–1.30)
Globulin: 2.6 g/dL (calc) (ref 1.9–3.7)
Glucose, Bld: 93 mg/dL (ref 65–99)
Potassium: 4.3 mmol/L (ref 3.5–5.3)
Sodium: 141 mmol/L (ref 135–146)
Total Bilirubin: 0.7 mg/dL (ref 0.2–1.2)
Total Protein: 7.5 g/dL (ref 6.1–8.1)
eGFR: 87 mL/min/{1.73_m2} (ref >=60)

## 2022-06-13 LAB — PSA, TOTAL AND FREE
PSA, % Free: 17 % (calc) — ABNORMAL LOW (ref >25)
PSA, Free: 0.2 ng/mL
PSA, Total: 1.2 ng/mL (ref <=4.0)

## 2022-06-13 LAB — LIPID PANEL
Cholesterol: 160 mg/dL (ref <200)
HDL: 33 mg/dL — ABNORMAL LOW (ref >=40)
LDL Cholesterol (Calc): 105 mg/dL (calc) — ABNORMAL HIGH
Non-HDL Cholesterol (Calc): 127 mg/dL (calc) (ref <130)
Total CHOL/HDL Ratio: 4.8 (calc) (ref <5.0)
Triglycerides: 121 mg/dL (ref <150)

## 2022-06-13 LAB — TSH: TSH: 3.96 mIU/L (ref 0.40–4.50)

## 2022-06-13 LAB — VITAMIN B12: Vitamin B-12: 518 pg/mL (ref 200–1100)

## 2022-07-02 DIAGNOSIS — M5412 Radiculopathy, cervical region: Secondary | ICD-10-CM | POA: Diagnosis not present

## 2022-07-24 ENCOUNTER — Telehealth (INDEPENDENT_AMBULATORY_CARE_PROVIDER_SITE_OTHER): Payer: BC Managed Care – PPO | Admitting: Sports Medicine

## 2022-07-24 DIAGNOSIS — R200 Anesthesia of skin: Secondary | ICD-10-CM | POA: Diagnosis not present

## 2022-07-24 NOTE — Progress Notes (Signed)
   Virtual Visit via WebEx/MyChart   I connected with  Martin Kennedy  on 07/24/22 via WebEx/MyChart/Doximity Video and verified that I am speaking with the correct person using two identifiers.   I discussed the limitations, risks, security and privacy concerns of performing an evaluation and management service by WebEx/MyChart/Doximity Video, including the higher likelihood of inaccurate diagnosis and treatment, and the availability of in person appointments.  We also discussed the likely need of an additional face to face encounter for complete and high quality delivery of care.  I also discussed with the patient that there may be a patient responsible charge related to this service. The patient expressed understanding and wishes to proceed.  Provider location is in medical facility. Patient location is at their home, different from provider location. People involved in care of the patient during this telehealth encounter were myself, my nurse/medical assistant, and my front office/scheduling team member.  Review of Systems: No fevers, chills, night sweats, weight loss, chest pain, or shortness of breath.   Objective Findings:    General: Speaking full sentences, no audible heavy breathing.  Sounds alert and appropriately interactive.  Appears well.  Face symmetric.  Extraocular movements intact.  Pupils equal and round.  No nasal flaring or accessory muscle use visualized.  Independent interpretation of tests performed by another provider:   None.  Brief History, Exam, Impression, and Recommendations:    Numbness hands and right leg/foot Aven and I are revisiting his paresthesias, he is a pleasant 57 year old male with known cervical and lumbar degenerative disc disease, he is also prediabetic with an A1c of 5.8%.  He was noting increasing paresthesias, numbness right hand, left hand, seemingly more of a C6 and C7 distribution over the dorsal radial aspect. He also had paresthesias  dorsum of the right foot particularly with palpation over the fibular midshaft. We discussed multiple potential etiologies including radiculopathy, peroneal entrapment, diabetic peripheral neuropathy, carpal tunnel syndrome, cubital tunnel syndrome. He did have an appointment with an orthopedic surgeon, they got a cervical and lumbar spine MRI, he also had a cervical epidural which unfortunately did not relieve his symptomatology. He and I did discuss nerve conduction and EMG which at this point I think is going to be necessary. We will try to find someone in [place] for him.   I discussed the above assessment and treatment plan with the patient. The patient was provided an opportunity to ask questions and all were answered. The patient agreed with the plan and demonstrated an understanding of the instructions.   The patient was advised to call back or seek an in-person evaluation if the symptoms worsen or if the condition fails to improve as anticipated.   I provided 30 minutes of face to face and non-face-to-face time during this encounter date, time was needed to gather information, review chart, records, communicate/coordinate with staff remotely, as well as complete documentation.   ____________________________________________ Martin Kennedy, M.D., ABFM., CAQSM., AME. Primary Care and Sports Medicine [facility]  Adjunct Professor of Family Medicine  [place] [employer]  Restaurant manager, fast food

## 2022-07-24 NOTE — Assessment & Plan Note (Signed)
Onalee Hua and I are revisiting his paresthesias, he is a pleasant 57 year old male with known cervical and lumbar degenerative disc disease, he is also prediabetic with an A1c of 5.8%.  He was noting increasing paresthesias, numbness right hand, left hand, seemingly more of a C6 and C7 distribution over the dorsal radial aspect. He also had paresthesias dorsum of the right foot particularly with palpation over the fibular midshaft. We discussed multiple potential etiologies including radiculopathy, peroneal entrapment, diabetic peripheral neuropathy, carpal tunnel syndrome, cubital tunnel syndrome. He did have an appointment with an orthopedic surgeon, they got a cervical and lumbar spine MRI, he also had a cervical epidural which unfortunately did not relieve his symptomatology. He and I did discuss nerve conduction and EMG which at this point I think is going to be necessary. We will try to find someone in [place] for him.

## 2022-08-20 ENCOUNTER — Telehealth (INDEPENDENT_AMBULATORY_CARE_PROVIDER_SITE_OTHER): Payer: BC Managed Care – PPO | Admitting: Sports Medicine

## 2022-08-20 DIAGNOSIS — M51369 Other intervertebral disc degeneration, lumbar region without mention of lumbar back pain or lower extremity pain: Secondary | ICD-10-CM

## 2022-08-20 DIAGNOSIS — M5136 Other intervertebral disc degeneration, lumbar region: Secondary | ICD-10-CM | POA: Diagnosis not present

## 2022-08-20 MED ORDER — GABAPENTIN 300 MG PO CAPS: ORAL_CAPSULE | ORAL | 3 refills | Status: DC

## 2022-08-20 NOTE — Assessment & Plan Note (Signed)
Martin Kennedy has known lumbar degenerative disc disease, he is status post L4-L5 laminectomy for progressive weakness and foot drop in the distant past. He has had on and off pain since then, fairly severe recently. He has had some L5 epidurals without sufficient improvement. More recently he is developing a sensation of heaviness and pain right lateral lower leg. He had an MRI recently in [place], the MRI did show lumbar degenerative disc disease, recurrent disc herniation L4-L5 with compression of both L5 nerve roots. We discussed in depth and at length his anatomy, the natural history and the treatment approach. He is interested in more of a curative approach. I did let him know that there really was no cure for this and management is the practical approach. We will try gabapentin again, I will order bilateral L5 selective epidurals, and he would like a consultation neurosurgery so we will set him up with Dr. Yetta Barre.

## 2022-08-20 NOTE — Progress Notes (Signed)
   Virtual Visit via WebEx/MyChart   I connected with  Martin Kennedy  on 08/20/22 via WebEx/MyChart/Doximity Video and verified that I am speaking with the correct person using two identifiers.   I discussed the limitations, risks, security and privacy concerns of performing an evaluation and management service by WebEx/MyChart/Doximity Video, including the higher likelihood of inaccurate diagnosis and treatment, and the availability of in person appointments.  We also discussed the likely need of an additional face to face encounter for complete and high quality delivery of care.  I also discussed with the patient that there may be a patient responsible charge related to this service. The patient expressed understanding and wishes to proceed.  Provider location is in medical facility. Patient location is at their home, different from provider location. People involved in care of the patient during this telehealth encounter were myself, my nurse/medical assistant, and my front office/scheduling team member.  Review of Systems: No fevers, chills, night sweats, weight loss, chest pain, or shortness of breath.   Objective Findings:    General: Speaking full sentences, no audible heavy breathing.  Sounds alert and appropriately interactive.  Appears well.  Face symmetric.  Extraocular movements intact.  Pupils equal and round.  No nasal flaring or accessory muscle use visualized.  Independent interpretation of tests performed by another provider:   None.  Brief History, Exam, Impression, and Recommendations:    Lumbar degenerative disc disease Martin Kennedy has known lumbar degenerative disc disease, he is status post L4-L5 laminectomy for progressive weakness and foot drop in the distant past. He has had on and off pain since then, fairly severe recently. He has had some L5 epidurals without sufficient improvement. More recently he is developing a sensation of heaviness and pain right lateral lower  leg. He had an MRI recently in [place], the MRI did show lumbar degenerative disc disease, recurrent disc herniation L4-L5 with compression of both L5 nerve roots. We discussed in depth and at length his anatomy, the natural history and the treatment approach. He is interested in more of a curative approach. I did let him know that there really was no cure for this and management is the practical approach. We will try gabapentin again, I will order bilateral L5 selective epidurals, and he would like a consultation neurosurgery so we will set him up with Martin Kennedy.   I discussed the above assessment and treatment plan with the patient. The patient was provided an opportunity to ask questions and all were answered. The patient agreed with the plan and demonstrated an understanding of the instructions.   The patient was advised to call back or seek an in-person evaluation if the symptoms worsen or if the condition fails to improve as anticipated.   I provided 40 minutes of face to face and non-face-to-face time during this encounter date, time was needed to gather information, review chart, records, communicate/coordinate with staff remotely, as well as complete documentation.   ____________________________________________ Ihor Austin. Benjamin Stain, M.D., ABFM., CAQSM., AME. Primary Care and Sports Medicine [facility]  Adjunct Professor of Family Medicine  [employer] [facility] [employer] [facility] [employer] [facility] [employer]  Restaurant manager, fast food

## 2022-08-22 ENCOUNTER — Telehealth: Payer: BC Managed Care – PPO | Admitting: Sports Medicine

## 2022-08-25 ENCOUNTER — Encounter: Payer: Self-pay | Admitting: Sports Medicine

## 2022-09-30 ENCOUNTER — Encounter: Payer: Self-pay | Admitting: Sports Medicine

## 2022-10-02 ENCOUNTER — Telehealth: Payer: Self-pay | Admitting: Sports Medicine

## 2022-10-02 DIAGNOSIS — Z13820 Encounter for screening for osteoporosis: Secondary | ICD-10-CM

## 2022-10-02 NOTE — Telephone Encounter (Signed)
Okay to give verbal orders for DEXA scan

## 2022-10-02 NOTE — Telephone Encounter (Signed)
Received incoming voicemail from [facility] requesting orders for Dexascan to be done on patient while he is in [place]. Please advise.   CB:[phone]

## 2022-10-03 NOTE — Telephone Encounter (Signed)
Yes, happy to, I will do this now and place it in the referral bin.

## 2022-10-03 NOTE — Telephone Encounter (Signed)
Dr. Karie Schwalbe,  Office is requesting orders to be signed and faxed. Can you please enter orders?

## 2022-10-04 NOTE — Telephone Encounter (Signed)
Contacted [facility] to confirm that Radiology department does Bone density testing. Received the following information;  [facility] [address] [place], [place] [address]  Ph: [phone] [phone]  [id number]

## 2022-10-04 NOTE — Telephone Encounter (Signed)
Contacted [employer], spoke with Sel P- No PA is required for Bone density testing reference#[id number].   Orders faxed to [phone]  [facility] [place], [place] [address]

## 2022-10-20 ENCOUNTER — Encounter: Payer: Self-pay | Admitting: Sports Medicine

## 2022-10-21 ENCOUNTER — Telehealth: Payer: Self-pay

## 2022-10-21 NOTE — Telephone Encounter (Signed)
Initiated Prior authorization [id number] 300MG  capsules  Via: Covermymeds Case/Key:[id number] Status: approved as of 10/21/22 Reason:however your insurance will cover cover 30 day prescriptions at a time, insurance will not pay for a 90 day supply, which means you can fill 30 tablets for a 30 day  supply each month, no pa is required at this time. this case is resolved  Notified Pt via: Mychart

## 2022-11-15 ENCOUNTER — Telehealth: Payer: BC Managed Care – PPO | Admitting: Sports Medicine

## 2022-11-15 DIAGNOSIS — J309 Allergic rhinitis, unspecified: Secondary | ICD-10-CM

## 2022-11-15 DIAGNOSIS — H1013 Acute atopic conjunctivitis, bilateral: Secondary | ICD-10-CM

## 2022-11-15 MED ORDER — LEVOCETIRIZINE DIHYDROCHLORIDE 5 MG PO TABS
5.0000 mg | ORAL_TABLET | Freq: Every evening | ORAL | 11 refills | Status: AC
Start: 2022-11-15 — End: ?

## 2022-11-15 MED ORDER — AZELASTINE HCL 0.05 % OP SOLN
2.0000 [drp] | Freq: Two times a day (BID) | OPHTHALMIC | 11 refills | Status: DC
Start: 2022-11-15 — End: 2022-11-19

## 2022-11-15 MED ORDER — AZELASTINE HCL 0.1 % NA SOLN: 2.0000 | Freq: Two times a day (BID) | NASAL | 1 refills | Status: DC

## 2022-11-15 NOTE — Progress Notes (Signed)
   Virtual Visit via WebEx/MyChart   I connected with  Martin Kennedy  on 11/15/22 via WebEx/MyChart/Doximity Video and verified that I am speaking with the correct person using two identifiers.   I discussed the limitations, risks, security and privacy concerns of performing an evaluation and management service by WebEx/MyChart/Doximity Video, including the higher likelihood of inaccurate diagnosis and treatment, and the availability of in person appointments.  We also discussed the likely need of an additional face to face encounter for complete and high quality delivery of care.  I also discussed with the patient that there may be a patient responsible charge related to this service. The patient expressed understanding and wishes to proceed.  Provider location is in medical facility. Patient location is at their home, different from provider location. People involved in care of the patient during this telehealth encounter were myself, my nurse/medical assistant, and my front office/scheduling team member.  Review of Systems: No fevers, chills, night sweats, weight loss, chest pain, or shortness of breath.   Objective Findings:    General: Speaking full sentences, no audible heavy breathing.  Sounds alert and appropriately interactive.  Appears well.  Face symmetric.  Extraocular movements intact.  Pupils equal and round.  No nasal flaring or accessory muscle use visualized.  Independent interpretation of tests performed by another provider:   None.  Brief History, Exam, Impression, and Recommendations:    Allergic conjunctivitis and rhinitis, bilateral Martin Kennedy lives in [place], he has had about 10 days of itchy watery eyes, runny nose with sneezing, no cough, no fevers, chills, muscle aches, body aches, no headache, no fatigue, no obvious sick contacts. No GI symptoms, no skin rash. No shortness of breath. I can see on the video visit that he does have allergic shiners bilaterally but  right worse than left, he does have fairly mild but diffuse conjunctival injection. He sounds nasal. We will add oral Xyzal, Optivar and Astelin. We can revisit this in 2 to 4 weeks if needed.   I discussed the above assessment and treatment plan with the patient. The patient was provided an opportunity to ask questions and all were answered. The patient agreed with the plan and demonstrated an understanding of the instructions.   The patient was advised to call back or seek an in-person evaluation if the symptoms worsen or if the condition fails to improve as anticipated.   I provided 30 minutes of face to face and non-face-to-face time during this encounter date, time was needed to gather information, review chart, records, communicate/coordinate with staff remotely, as well as complete documentation.   ____________________________________________ Ihor Austin. Benjamin Stain, M.D., ABFM., CAQSM., AME. Primary Care and Sports Medicine [facility]  Adjunct Professor of Family Medicine  [employer]  Restaurant manager, fast food

## 2022-11-15 NOTE — Assessment & Plan Note (Signed)
Martin Kennedy lives in [place], he has had about 10 days of itchy watery eyes, runny nose with sneezing, no cough, no fevers, chills, muscle aches, body aches, no headache, no fatigue, no obvious sick contacts. No GI symptoms, no skin rash. No shortness of breath. I can see on the video visit that he does have allergic shiners bilaterally but right worse than left, he does have fairly mild but diffuse conjunctival injection. He sounds nasal. We will add oral Xyzal, Optivar and Astelin. We can revisit this in 2 to 4 weeks if needed.

## 2022-11-18 ENCOUNTER — Encounter: Payer: Self-pay | Admitting: Sports Medicine

## 2022-11-18 DIAGNOSIS — M5136 Other intervertebral disc degeneration, lumbar region: Secondary | ICD-10-CM

## 2022-11-18 DIAGNOSIS — M51369 Other intervertebral disc degeneration, lumbar region without mention of lumbar back pain or lower extremity pain: Secondary | ICD-10-CM

## 2022-11-18 DIAGNOSIS — H1013 Acute atopic conjunctivitis, bilateral: Secondary | ICD-10-CM

## 2022-11-19 ENCOUNTER — Other Ambulatory Visit: Payer: Self-pay | Admitting: Sports Medicine

## 2022-11-19 DIAGNOSIS — H1013 Acute atopic conjunctivitis, bilateral: Secondary | ICD-10-CM

## 2022-11-19 MED ORDER — AZELASTINE HCL 0.05 % OP SOLN: 2.0000 [drp] | Freq: Two times a day (BID) | OPHTHALMIC | 11 refills | Status: AC

## 2022-11-19 MED ORDER — GABAPENTIN 300 MG PO CAPS
300.0000 mg | ORAL_CAPSULE | Freq: Three times a day (TID) | ORAL | 3 refills | Status: AC
Start: 2022-11-19 — End: ?

## 2022-11-19 MED ORDER — AZELASTINE HCL 0.1 % NA SOLN
2.0000 | Freq: Two times a day (BID) | NASAL | 1 refills | Status: AC
Start: 2022-11-19 — End: ?

## 2023-02-07 ENCOUNTER — Ambulatory Visit: Payer: BC Managed Care – PPO | Admitting: Sports Medicine

## 2023-02-12 ENCOUNTER — Ambulatory Visit: Payer: BC Managed Care – PPO | Admitting: Sports Medicine

## 2023-02-14 ENCOUNTER — Ambulatory Visit (INDEPENDENT_AMBULATORY_CARE_PROVIDER_SITE_OTHER): Payer: BC Managed Care – PPO | Admitting: Sports Medicine

## 2023-02-14 ENCOUNTER — Encounter: Payer: Self-pay | Admitting: Sports Medicine

## 2023-02-14 ENCOUNTER — Ambulatory Visit: Payer: BC Managed Care – PPO | Attending: Sports Medicine

## 2023-02-14 ENCOUNTER — Ambulatory Visit: Payer: BC Managed Care – PPO

## 2023-02-14 VITALS — BP 113/72 | HR 78 | Ht 72.0 in | Wt 236.0 lb

## 2023-02-14 DIAGNOSIS — R002 Palpitations: Secondary | ICD-10-CM | POA: Diagnosis not present

## 2023-02-14 DIAGNOSIS — M47816 Spondylosis without myelopathy or radiculopathy, lumbar region: Secondary | ICD-10-CM | POA: Diagnosis not present

## 2023-02-14 DIAGNOSIS — G8929 Other chronic pain: Secondary | ICD-10-CM | POA: Diagnosis not present

## 2023-02-14 DIAGNOSIS — M51369 Other intervertebral disc degeneration, lumbar region without mention of lumbar back pain or lower extremity pain: Secondary | ICD-10-CM

## 2023-02-14 DIAGNOSIS — M545 Low back pain, unspecified: Secondary | ICD-10-CM | POA: Diagnosis not present

## 2023-02-14 DIAGNOSIS — M4317 Spondylolisthesis, lumbosacral region: Secondary | ICD-10-CM | POA: Diagnosis not present

## 2023-02-14 NOTE — Assessment & Plan Note (Signed)
Martin Kennedy does have a strong family history of cardiac disease, he is however quite active, he typically lives in [place] and is able to hike many miles at high altitudes without any evidence of chest pain. He did however have an episode of palpitations with heart rates well over 110 through the night after an epidural injection. I suspect this is simply related to the steroid medication injected however considering his strong family history I would like to further investigate the rhythm to ensure were not missing paroxysmal atrial fibrillation. We will going get him a long-term Zio patch monitor. Considering his physical endurance when exercising I do not think were dealing with an anginal or ischemic process.

## 2023-02-14 NOTE — Progress Notes (Unsigned)
Enrolled patient for a 14 day Zio XT monitor to be mailed to patients home  EP to read

## 2023-02-14 NOTE — Assessment & Plan Note (Addendum)
This is a very pleasant 57 year old male, he has known lumbar DDD, he is status post L4-L5 laminectomy for progressive weakness and foot drop in the distant past. He has had on and off pain, ultimately MRI did show recurrent L4-L5 disc protrusion potentially affecting the L5 nerve roots. He did well with a lumbar epidural, and would like another, he will let me know where to order it, likely bilateral L5-S1 transforaminal he did however have some palpitations after the epidural. He did see an orthopedic surgeon who suggested an additional lumbar surgery, potentially fusion if instability was noted so I am happy to order the flexion/extension and seated lateral views. Please see below for workup of the palpitations.

## 2023-02-14 NOTE — Progress Notes (Signed)
    Procedures performed today:    None.  Independent interpretation of notes and tests performed by another provider:   None.  Brief History, Exam, Impression, and Recommendations:    Lumbar degenerative disc disease This is a very pleasant 57 year old male, he has known lumbar DDD, he is status post L4-L5 laminectomy for progressive weakness and foot drop in the distant past. He has had on and off pain, ultimately MRI did show recurrent L4-L5 disc protrusion potentially affecting the L5 nerve roots. He did well with a lumbar epidural, and would like another, he will let me know where to order it, likely bilateral L5-S1 transforaminal he did however have some palpitations after the epidural. He did see an orthopedic surgeon who suggested an additional lumbar surgery, potentially fusion if instability was noted so I am happy to order the flexion/extension and seated lateral views. Please see below for workup of the palpitations.  Palpitations Dezmin does have a strong family history of cardiac disease, he is however quite active, he typically lives in [place] and is able to hike many miles at high altitudes without any evidence of chest pain. He did however have an episode of palpitations with heart rates well over 110 through the night after an epidural injection. I suspect this is simply related to the steroid medication injected however considering his strong family history I would like to further investigate the rhythm to ensure were not missing paroxysmal atrial fibrillation. We will going get him a long-term Zio patch monitor. Considering his physical endurance when exercising I do not think were dealing with an anginal or ischemic process.    ____________________________________________ Ihor Austin. Benjamin Stain, M.D., ABFM., CAQSM., AME. Primary Care and Sports Medicine [facility]  Adjunct Professor of Family Medicine  [employer]  [employer]  Restaurant manager, fast food

## 2023-02-17 ENCOUNTER — Other Ambulatory Visit: Payer: Self-pay | Admitting: *Deleted

## 2023-02-17 ENCOUNTER — Ambulatory Visit: Payer: BC Managed Care – PPO | Attending: Sports Medicine

## 2023-02-17 DIAGNOSIS — R002 Palpitations: Secondary | ICD-10-CM

## 2023-02-17 NOTE — Progress Notes (Unsigned)
Enrolled for [employer] to mail a ZIO XT long term holter monitor to [address] [place], [place], [place]  [address]  EP to read

## 2023-02-18 ENCOUNTER — Encounter: Payer: Self-pay | Admitting: Sports Medicine

## 2023-04-19 ENCOUNTER — Other Ambulatory Visit: Payer: Self-pay | Admitting: Sports Medicine

## 2023-05-08 ENCOUNTER — Encounter: Payer: Self-pay | Admitting: Sports Medicine

## 2023-09-11 ENCOUNTER — Encounter: Payer: Self-pay | Admitting: Sports Medicine

## 2023-09-15 NOTE — Telephone Encounter (Signed)
 Copied and pasted request for lab results  for Martin Kennedy into a message in patient's chart

## 2023-12-09 ENCOUNTER — Encounter: Payer: Self-pay | Admitting: Sports Medicine
